# Patient Record
Sex: Male | Born: 1945
Health system: Southern US, Community
[De-identification: ages and names within clinical notes are randomized; demographics above are authoritative.]

## PROBLEM LIST (undated history)

## (undated) DIAGNOSIS — E039 Hypothyroidism, unspecified: Secondary | ICD-10-CM

## (undated) DIAGNOSIS — H269 Unspecified cataract: Secondary | ICD-10-CM

## (undated) DIAGNOSIS — K589 Irritable bowel syndrome without diarrhea: Secondary | ICD-10-CM

## (undated) DIAGNOSIS — C801 Malignant (primary) neoplasm, unspecified: Secondary | ICD-10-CM

## (undated) DIAGNOSIS — E079 Disorder of thyroid, unspecified: Secondary | ICD-10-CM

## (undated) DIAGNOSIS — T7840XA Allergy, unspecified, initial encounter: Secondary | ICD-10-CM

## (undated) DIAGNOSIS — E785 Hyperlipidemia, unspecified: Secondary | ICD-10-CM

## (undated) HISTORY — DX: Unspecified cataract: H26.9

## (undated) HISTORY — PX: BACK SURGERY: SHX140

## (undated) HISTORY — PX: CO2 LASER OF LEUKOPLAKIA: SHX1364

## (undated) HISTORY — PX: OTHER SURGICAL HISTORY: SHX169

## (undated) HISTORY — DX: Disorder of thyroid, unspecified: E07.9

## (undated) HISTORY — PX: HERNIA REPAIR: SHX51

## (undated) HISTORY — DX: Allergy, unspecified, initial encounter: T78.40XA

## (undated) HISTORY — PX: SPINE SURGERY: SHX786

## (undated) HISTORY — PX: EYE SURGERY: SHX253

## (undated) HISTORY — DX: Hyperlipidemia, unspecified: E78.5

## (undated) HISTORY — PX: SKIN CANCER DESTRUCTION: SHX778

## (undated) HISTORY — DX: Irritable bowel syndrome, unspecified: K58.9

---

## 2004-07-29 ENCOUNTER — Other Ambulatory Visit: Payer: Self-pay

## 2004-07-29 ENCOUNTER — Emergency Department: Payer: Self-pay | Admitting: Emergency Medicine

## 2004-07-30 ENCOUNTER — Ambulatory Visit: Payer: Self-pay | Admitting: Emergency Medicine

## 2005-01-02 ENCOUNTER — Ambulatory Visit: Payer: Self-pay | Admitting: Family Medicine

## 2005-12-23 ENCOUNTER — Ambulatory Visit: Payer: Self-pay

## 2005-12-30 ENCOUNTER — Ambulatory Visit: Payer: Self-pay | Admitting: Family Medicine

## 2006-06-14 ENCOUNTER — Emergency Department: Payer: Self-pay | Admitting: Emergency Medicine

## 2007-01-13 ENCOUNTER — Ambulatory Visit: Payer: Self-pay | Admitting: Family Medicine

## 2007-09-11 ENCOUNTER — Ambulatory Visit: Payer: Self-pay | Admitting: Emergency Medicine

## 2008-01-04 ENCOUNTER — Ambulatory Visit: Payer: Self-pay | Admitting: Family Medicine

## 2008-08-04 ENCOUNTER — Ambulatory Visit: Payer: Self-pay | Admitting: Family Medicine

## 2009-01-16 ENCOUNTER — Ambulatory Visit: Payer: Self-pay | Admitting: Family Medicine

## 2009-05-22 ENCOUNTER — Ambulatory Visit: Payer: Self-pay | Admitting: Anesthesiology

## 2009-05-24 ENCOUNTER — Ambulatory Visit: Payer: Self-pay | Admitting: Unknown Physician Specialty

## 2009-06-01 ENCOUNTER — Ambulatory Visit: Payer: Self-pay | Admitting: Anesthesiology

## 2009-06-14 ENCOUNTER — Ambulatory Visit: Payer: Self-pay | Admitting: Anesthesiology

## 2010-09-04 ENCOUNTER — Ambulatory Visit: Payer: Self-pay | Admitting: Gastroenterology

## 2010-09-11 ENCOUNTER — Other Ambulatory Visit: Payer: Self-pay | Admitting: Family Medicine

## 2011-02-20 ENCOUNTER — Other Ambulatory Visit: Payer: Self-pay | Admitting: Family Medicine

## 2012-03-15 ENCOUNTER — Other Ambulatory Visit: Payer: Self-pay | Admitting: Family Medicine

## 2012-06-27 ENCOUNTER — Ambulatory Visit: Payer: Self-pay

## 2012-06-27 LAB — RAPID STREP-A WITH REFLX: Micro Text Report: NEGATIVE

## 2012-06-29 LAB — BETA STREP CULTURE(ARMC)

## 2013-01-27 ENCOUNTER — Other Ambulatory Visit: Payer: Self-pay | Admitting: Family Medicine

## 2013-01-27 DIAGNOSIS — E039 Hypothyroidism, unspecified: Secondary | ICD-10-CM | POA: Diagnosis not present

## 2013-01-27 LAB — TSH: Thyroid Stimulating Horm: 1.84 u[IU]/mL

## 2013-01-28 LAB — PSA: PSA: 0.5 ng/mL (ref 0.0–4.0)

## 2013-02-04 DIAGNOSIS — E039 Hypothyroidism, unspecified: Secondary | ICD-10-CM | POA: Diagnosis not present

## 2013-07-06 DIAGNOSIS — Z23 Encounter for immunization: Secondary | ICD-10-CM | POA: Diagnosis not present

## 2013-08-08 ENCOUNTER — Other Ambulatory Visit: Payer: Self-pay | Admitting: Family Medicine

## 2013-08-08 DIAGNOSIS — J309 Allergic rhinitis, unspecified: Secondary | ICD-10-CM | POA: Diagnosis not present

## 2013-08-08 DIAGNOSIS — Z23 Encounter for immunization: Secondary | ICD-10-CM | POA: Diagnosis not present

## 2013-08-08 DIAGNOSIS — E039 Hypothyroidism, unspecified: Secondary | ICD-10-CM | POA: Diagnosis not present

## 2013-08-08 DIAGNOSIS — Z1331 Encounter for screening for depression: Secondary | ICD-10-CM | POA: Diagnosis not present

## 2013-08-08 DIAGNOSIS — Z0000 Encounter for general adult medical examination without abnormal findings: Secondary | ICD-10-CM | POA: Diagnosis not present

## 2013-08-08 DIAGNOSIS — Z1211 Encounter for screening for malignant neoplasm of colon: Secondary | ICD-10-CM | POA: Diagnosis not present

## 2013-08-19 ENCOUNTER — Other Ambulatory Visit: Payer: Self-pay | Admitting: Family Medicine

## 2013-08-19 DIAGNOSIS — Z125 Encounter for screening for malignant neoplasm of prostate: Secondary | ICD-10-CM | POA: Diagnosis not present

## 2013-09-07 DIAGNOSIS — H35379 Puckering of macula, unspecified eye: Secondary | ICD-10-CM | POA: Diagnosis not present

## 2013-09-07 DIAGNOSIS — H524 Presbyopia: Secondary | ICD-10-CM | POA: Diagnosis not present

## 2013-09-07 DIAGNOSIS — H2510 Age-related nuclear cataract, unspecified eye: Secondary | ICD-10-CM | POA: Diagnosis not present

## 2013-12-23 DIAGNOSIS — A499 Bacterial infection, unspecified: Secondary | ICD-10-CM | POA: Diagnosis not present

## 2013-12-23 DIAGNOSIS — B9689 Other specified bacterial agents as the cause of diseases classified elsewhere: Secondary | ICD-10-CM | POA: Diagnosis not present

## 2013-12-23 DIAGNOSIS — J309 Allergic rhinitis, unspecified: Secondary | ICD-10-CM | POA: Diagnosis not present

## 2013-12-23 DIAGNOSIS — J329 Chronic sinusitis, unspecified: Secondary | ICD-10-CM | POA: Diagnosis not present

## 2014-01-30 DIAGNOSIS — M7660 Achilles tendinitis, unspecified leg: Secondary | ICD-10-CM | POA: Diagnosis not present

## 2014-02-20 DIAGNOSIS — E039 Hypothyroidism, unspecified: Secondary | ICD-10-CM | POA: Diagnosis not present

## 2014-02-20 DIAGNOSIS — Z87891 Personal history of nicotine dependence: Secondary | ICD-10-CM | POA: Diagnosis not present

## 2014-02-20 DIAGNOSIS — Z125 Encounter for screening for malignant neoplasm of prostate: Secondary | ICD-10-CM | POA: Diagnosis not present

## 2014-02-20 DIAGNOSIS — Z136 Encounter for screening for cardiovascular disorders: Secondary | ICD-10-CM | POA: Diagnosis not present

## 2014-02-21 ENCOUNTER — Ambulatory Visit: Payer: Self-pay | Admitting: Family Medicine

## 2014-02-21 DIAGNOSIS — Z136 Encounter for screening for cardiovascular disorders: Secondary | ICD-10-CM | POA: Diagnosis not present

## 2014-02-21 LAB — TSH: Thyroid Stimulating Horm: 2.06 u[IU]/mL

## 2014-04-26 DIAGNOSIS — J189 Pneumonia, unspecified organism: Secondary | ICD-10-CM | POA: Diagnosis not present

## 2014-05-22 DIAGNOSIS — M7660 Achilles tendinitis, unspecified leg: Secondary | ICD-10-CM | POA: Diagnosis not present

## 2014-08-02 DIAGNOSIS — A499 Bacterial infection, unspecified: Secondary | ICD-10-CM | POA: Diagnosis not present

## 2014-08-02 DIAGNOSIS — M7661 Achilles tendinitis, right leg: Secondary | ICD-10-CM | POA: Diagnosis not present

## 2014-08-02 DIAGNOSIS — J329 Chronic sinusitis, unspecified: Secondary | ICD-10-CM | POA: Diagnosis not present

## 2014-09-13 DIAGNOSIS — H2513 Age-related nuclear cataract, bilateral: Secondary | ICD-10-CM | POA: Diagnosis not present

## 2014-09-13 DIAGNOSIS — H35372 Puckering of macula, left eye: Secondary | ICD-10-CM | POA: Diagnosis not present

## 2014-09-14 DIAGNOSIS — Z0000 Encounter for general adult medical examination without abnormal findings: Secondary | ICD-10-CM | POA: Diagnosis not present

## 2014-09-14 DIAGNOSIS — Z9181 History of falling: Secondary | ICD-10-CM | POA: Diagnosis not present

## 2014-09-14 DIAGNOSIS — Z23 Encounter for immunization: Secondary | ICD-10-CM | POA: Diagnosis not present

## 2014-09-14 DIAGNOSIS — Z1389 Encounter for screening for other disorder: Secondary | ICD-10-CM | POA: Diagnosis not present

## 2014-09-14 DIAGNOSIS — E039 Hypothyroidism, unspecified: Secondary | ICD-10-CM | POA: Diagnosis not present

## 2014-09-19 DIAGNOSIS — X32XXXA Exposure to sunlight, initial encounter: Secondary | ICD-10-CM | POA: Diagnosis not present

## 2014-09-19 DIAGNOSIS — Z85828 Personal history of other malignant neoplasm of skin: Secondary | ICD-10-CM | POA: Diagnosis not present

## 2014-09-19 DIAGNOSIS — D2261 Melanocytic nevi of right upper limb, including shoulder: Secondary | ICD-10-CM | POA: Diagnosis not present

## 2014-09-19 DIAGNOSIS — D225 Melanocytic nevi of trunk: Secondary | ICD-10-CM | POA: Diagnosis not present

## 2014-09-19 DIAGNOSIS — L570 Actinic keratosis: Secondary | ICD-10-CM | POA: Diagnosis not present

## 2014-09-19 DIAGNOSIS — D2262 Melanocytic nevi of left upper limb, including shoulder: Secondary | ICD-10-CM | POA: Diagnosis not present

## 2014-11-06 ENCOUNTER — Ambulatory Visit: Payer: Self-pay | Admitting: Family Medicine

## 2014-11-06 DIAGNOSIS — E039 Hypothyroidism, unspecified: Secondary | ICD-10-CM | POA: Diagnosis not present

## 2014-11-06 DIAGNOSIS — Z125 Encounter for screening for malignant neoplasm of prostate: Secondary | ICD-10-CM | POA: Diagnosis not present

## 2014-11-06 LAB — COMPREHENSIVE METABOLIC PANEL
ALT: 31 U/L
ANION GAP: 4 — AB (ref 7–16)
Albumin: 3.7 g/dL (ref 3.4–5.0)
Alkaline Phosphatase: 90 U/L
BILIRUBIN TOTAL: 0.6 mg/dL (ref 0.2–1.0)
BUN: 14 mg/dL (ref 7–18)
CHLORIDE: 108 mmol/L — AB (ref 98–107)
CREATININE: 1.34 mg/dL — AB (ref 0.60–1.30)
Calcium, Total: 8.5 mg/dL (ref 8.5–10.1)
Co2: 28 mmol/L (ref 21–32)
EGFR (African American): >60
EGFR (Non-African Amer.): 57 — ABNORMAL LOW
Glucose: 104 mg/dL — ABNORMAL HIGH (ref 65–99)
OSMOLALITY: 280 (ref 275–301)
Potassium: 4.2 mmol/L (ref 3.5–5.1)
SGOT(AST): 24 U/L (ref 15–37)
SODIUM: 140 mmol/L (ref 136–145)
Total Protein: 6.9 g/dL (ref 6.4–8.2)

## 2014-11-06 LAB — TSH: Thyroid Stimulating Horm: 3.19 u[IU]/mL

## 2014-11-07 LAB — PSA: PSA: 0.6 ng/mL

## 2015-01-22 DIAGNOSIS — J069 Acute upper respiratory infection, unspecified: Secondary | ICD-10-CM | POA: Diagnosis not present

## 2015-01-22 DIAGNOSIS — J40 Bronchitis, not specified as acute or chronic: Secondary | ICD-10-CM | POA: Diagnosis not present

## 2015-02-14 ENCOUNTER — Ambulatory Visit
Admission: RE | Admit: 2015-02-14 | Discharge: 2015-02-14 | Disposition: A | Discharge status: Home / Self Care | Payer: Medicare Other | Attending: Family Medicine | Admitting: Family Medicine

## 2015-02-14 ENCOUNTER — Other Ambulatory Visit: Payer: Self-pay | Admitting: Family Medicine

## 2015-02-14 ENCOUNTER — Ambulatory Visit
Admission: RE | Admit: 2015-02-14 | Discharge: 2015-02-14 | Disposition: A | Discharge status: Home / Self Care | Payer: Medicare Other | Source: Ambulatory Visit | Attending: Family Medicine | Admitting: Family Medicine

## 2015-02-14 ENCOUNTER — Other Ambulatory Visit: Payer: Self-pay | Admitting: Internal Medicine

## 2015-02-14 DIAGNOSIS — R05 Cough: Secondary | ICD-10-CM | POA: Diagnosis not present

## 2015-02-14 DIAGNOSIS — R059 Cough, unspecified: Secondary | ICD-10-CM

## 2015-02-14 DIAGNOSIS — J189 Pneumonia, unspecified organism: Secondary | ICD-10-CM | POA: Diagnosis not present

## 2015-02-14 DIAGNOSIS — R509 Fever, unspecified: Secondary | ICD-10-CM | POA: Diagnosis not present

## 2015-03-02 NOTE — Progress Notes (Signed)
Quick Note:  Patient already notified on [facility] chart ______

## 2015-05-03 ENCOUNTER — Ambulatory Visit (INDEPENDENT_AMBULATORY_CARE_PROVIDER_SITE_OTHER): Payer: Medicare Other | Admitting: Family Medicine

## 2015-05-03 ENCOUNTER — Encounter: Payer: Self-pay | Admitting: Family Medicine

## 2015-05-03 VITALS — BP 118/72 | HR 61 | Temp 98.1°F | Resp 18 | Ht 73.0 in | Wt 196.6 lb

## 2015-05-03 DIAGNOSIS — E038 Other specified hypothyroidism: Secondary | ICD-10-CM | POA: Diagnosis not present

## 2015-05-03 MED ORDER — LEVOTHYROXINE SODIUM 112 MCG PO TABS: 112.0000 ug | ORAL_TABLET | Freq: Once | ORAL | Status: DC

## 2015-05-03 NOTE — Patient Instructions (Signed)
6 

## 2015-05-03 NOTE — Progress Notes (Signed)
Name: Marc Jackson   MRN: [id number]    DOB: [birth year]   Date:05/03/2015       Progress Note  Subjective  Chief Complaint  Chief Complaint  Patient presents with  . Hypothyroidism    Thyroid Problem Presents for follow-up visit. Symptoms include hair loss. Patient reports no anxiety, cold intolerance, constipation, depressed mood, diarrhea, heat intolerance, hoarse voice, leg swelling, palpitations, tremors, weight gain or weight loss. The symptoms have been stable.      Past Medical History  Diagnosis Date  . Thyroid disease     History  Substance Use Topics  . Smoking status: Former Research scientist (life sciences)  . Smokeless tobacco: Not on file  . Alcohol Use: No     Current outpatient prescriptions:  .  levothyroxine (SYNTHROID, LEVOTHROID) 112 MCG tablet, , Disp: , Rfl:   Allergies  Allergen Reactions  . Penicillins   . Tetanus Toxoids     Review of Systems  Constitutional: Negative for fever, chills, weight loss and weight gain.  HENT: Negative for congestion, hearing loss, hoarse voice, sore throat and tinnitus.   Eyes: Negative for blurred vision, double vision and redness.  Respiratory: Negative for cough, hemoptysis and shortness of breath.   Cardiovascular: Negative for chest pain, palpitations, orthopnea, claudication and leg swelling.  Gastrointestinal: Negative for heartburn, nausea, vomiting, diarrhea, constipation and blood in stool.  Genitourinary: Negative for dysuria, urgency, frequency and hematuria.  Musculoskeletal: Negative for myalgias, back pain, joint pain, falls and neck pain.  Skin: Negative for itching.  Neurological: Negative for dizziness, tingling, tremors, focal weakness, seizures, loss of consciousness, weakness and headaches.  Endo/Heme/Allergies: Negative for cold intolerance and heat intolerance. Does not bruise/bleed easily.  Psychiatric/Behavioral: Negative for depression and substance abuse. The patient is not nervous/anxious and does not have  insomnia.      Objective  Filed Vitals:   05/03/15 1021  BP: 118/72  Pulse: 61  Temp: 98.1 F (36.7 C)  TempSrc: Oral  Resp: 18  Height: 6\' 1"  (1.854 m)  Weight: 196 lb 9.6 oz (89.177 kg)  SpO2: 94%     Physical Exam  Constitutional: He is oriented to person, place, and time and well-developed, well-nourished, and in no distress.  HENT:  Head: Normocephalic.  Eyes: EOM are normal. Pupils are equal, round, and reactive to light.  Neck: Normal range of motion. Neck supple. No thyromegaly present.  Cardiovascular: Normal rate, regular rhythm and normal heart sounds.   No murmur heard. Pulmonary/Chest: Effort normal and breath sounds normal. No respiratory distress. He has no wheezes.  Musculoskeletal: Normal range of motion. He exhibits no edema.  Lymphadenopathy:    He has no cervical adenopathy.  Neurological: He is alert and oriented to person, place, and time. No cranial nerve deficit. Gait normal. Coordination normal.  Skin: Skin is warm and dry. No rash noted.  Psychiatric: Affect and judgment normal.      Assessment & Plan

## 2015-05-04 LAB — TSH: TSH: 4.02 u[IU]/mL (ref 0.450–4.500)

## 2015-05-08 ENCOUNTER — Telehealth: Payer: Self-pay | Admitting: Emergency Medicine

## 2015-05-08 NOTE — Telephone Encounter (Signed)
Letter mailed to patient.

## 2015-09-18 DIAGNOSIS — D2261 Melanocytic nevi of right upper limb, including shoulder: Secondary | ICD-10-CM | POA: Diagnosis not present

## 2015-09-18 DIAGNOSIS — Z85828 Personal history of other malignant neoplasm of skin: Secondary | ICD-10-CM | POA: Diagnosis not present

## 2015-09-18 DIAGNOSIS — D2272 Melanocytic nevi of left lower limb, including hip: Secondary | ICD-10-CM | POA: Diagnosis not present

## 2015-09-18 DIAGNOSIS — D225 Melanocytic nevi of trunk: Secondary | ICD-10-CM | POA: Diagnosis not present

## 2015-09-19 DIAGNOSIS — H2513 Age-related nuclear cataract, bilateral: Secondary | ICD-10-CM | POA: Diagnosis not present

## 2015-09-19 DIAGNOSIS — H35372 Puckering of macula, left eye: Secondary | ICD-10-CM | POA: Diagnosis not present

## 2015-11-07 ENCOUNTER — Encounter: Payer: Medicare Other | Admitting: Family Medicine

## 2015-11-09 ENCOUNTER — Encounter: Payer: Self-pay | Admitting: Family Medicine

## 2015-11-09 ENCOUNTER — Encounter: Payer: Medicare Other | Admitting: Family Medicine

## 2015-11-09 ENCOUNTER — Ambulatory Visit (INDEPENDENT_AMBULATORY_CARE_PROVIDER_SITE_OTHER): Payer: Medicare Other | Admitting: Family Medicine

## 2015-11-09 VITALS — BP 122/78 | HR 72 | Temp 97.2°F | Resp 14 | Ht 73.0 in | Wt 198.0 lb

## 2015-11-09 DIAGNOSIS — Z85828 Personal history of other malignant neoplasm of skin: Secondary | ICD-10-CM | POA: Diagnosis not present

## 2015-11-09 DIAGNOSIS — Z113 Encounter for screening for infections with a predominantly sexual mode of transmission: Secondary | ICD-10-CM | POA: Diagnosis not present

## 2015-11-09 DIAGNOSIS — Z125 Encounter for screening for malignant neoplasm of prostate: Secondary | ICD-10-CM

## 2015-11-09 DIAGNOSIS — Z13220 Encounter for screening for lipoid disorders: Secondary | ICD-10-CM | POA: Diagnosis not present

## 2015-11-09 DIAGNOSIS — J309 Allergic rhinitis, unspecified: Secondary | ICD-10-CM

## 2015-11-09 DIAGNOSIS — E034 Atrophy of thyroid (acquired): Secondary | ICD-10-CM | POA: Diagnosis not present

## 2015-11-09 DIAGNOSIS — IMO0001 Reserved for inherently not codable concepts without codable children: Secondary | ICD-10-CM

## 2015-11-09 DIAGNOSIS — Z0000 Encounter for general adult medical examination without abnormal findings: Secondary | ICD-10-CM

## 2015-11-09 DIAGNOSIS — R0982 Postnasal drip: Secondary | ICD-10-CM

## 2015-11-09 DIAGNOSIS — Z136 Encounter for screening for cardiovascular disorders: Secondary | ICD-10-CM

## 2015-11-09 DIAGNOSIS — E038 Other specified hypothyroidism: Secondary | ICD-10-CM | POA: Diagnosis not present

## 2015-11-09 HISTORY — DX: Postnasal drip: R09.82

## 2015-11-09 HISTORY — DX: Encounter for general adult medical examination without abnormal findings: Z00.00

## 2015-11-09 HISTORY — DX: Allergic rhinitis, unspecified: J30.9

## 2015-11-09 NOTE — Progress Notes (Signed)
Name: Marc Jackson   MRN: [id number]    DOB: May 29, 1946   Date:11/09/2015       Progress Note  Subjective  Chief Complaint  Chief Complaint  Patient presents with  . Annual Exam    HPI  Patient is here today for a Complete Male Physical Exam:  The patient has no acute concerns. Overall feels healthy. Diet is well balanced. In general does exercise regularly, avid cyclist. Sees dentist regularly and addresses vision concerns with ophthalmologist if applicable. In regards to sexual activity the patient is currently sexually active. Currently is not concerned about exposure to any STDs. Due to have his lab work checked. Family history of prostate cancer in father, he would like PSA testing. Due to have thyroid hormones checked as well for well controled hypothyroidism.    Past Medical History  Diagnosis Date  . Thyroid disease     Past Surgical History  Procedure Laterality Date  . Back surgery    . Discetomy    . Hernia repair    . Co2 laser of leukoplakia    . Skin cancer destruction      Family History  Problem Relation Age of Onset  . Cancer Father   . Heart disease Father   . Hypothyroidism Sister   . Heart disease Brother   . Diabetes Brother   . Obesity Brother     Social History   Social History  . Marital Status: Married    Spouse Name: N/A  . Number of Children: N/A  . Years of Education: N/A   Occupational History  . Not on file.   Social History Main Topics  . Smoking status: Former Research scientist (life sciences)  . Smokeless tobacco: Not on file  . Alcohol Use: No  . Drug Use: No  . Sexual Activity:    Partners: Female   Other Topics Concern  . Not on file   Social History Narrative  . No narrative on file     Current outpatient prescriptions:  .  levothyroxine (SYNTHROID, LEVOTHROID) 112 MCG tablet, Take 1 tablet (112 mcg total) by mouth once., Disp: 90 tablet, Rfl: 3  Allergies  Allergen Reactions  . Penicillins   . Tetanus Toxoids      ROS  CONSTITUTIONAL: No significant weight changes, fever, chills, weakness or fatigue.  HEENT:  - Eyes: No visual changes.  - Ears: No auditory changes. No pain.  - Nose: No sneezing, congestion, runny nose. - Throat: No sore throat. No changes in swallowing. SKIN: No rash or itching.  CARDIOVASCULAR: No chest pain, chest pressure or chest discomfort. No palpitations or edema.  RESPIRATORY: No shortness of breath, cough or sputum.  GASTROINTESTINAL: No anorexia, nausea, vomiting. No changes in bowel habits. No abdominal pain or blood.  GENITOURINARY: No dysuria. No frequency. No discharge.  NEUROLOGICAL: No headache, dizziness, syncope, paralysis, ataxia, numbness or tingling in the extremities. No memory changes. No change in bowel or bladder control.  MUSCULOSKELETAL: No joint pain. No muscle pain. HEMATOLOGIC: No anemia, bleeding or bruising.  LYMPHATICS: No enlarged lymph nodes.  PSYCHIATRIC: No change in mood. No change in sleep pattern.  ENDOCRINOLOGIC: No reports of sweating, cold or heat intolerance. No polyuria or polydipsia.   Objective  Filed Vitals:   11/09/15 1106  BP: 122/78  Pulse: 72  Temp: 97.2 F (36.2 C)  TempSrc: Oral  Resp: 14  Height: 6\' 1"  (1.854 m)  Weight: 198 lb (89.812 kg)  SpO2: 94%   Body mass index is  26.13 kg/(m^2).  Depression screen PHQ 2/9 11/09/2015  Decreased Interest 0  Down, Depressed, Hopeless 0  PHQ - 2 Score 0     Physical Exam  Constitutional: Patient appears well-developed and well-nourished. In no distress.  HEENT:  - Head: Normocephalic and atraumatic.  - Ears: Bilateral TMs gray, no erythema or effusion - Nose: Nasal mucosa moist - Mouth/Throat: Oropharynx is clear and moist. No tonsillar hypertrophy or erythema. No post nasal drainage.  - Eyes: Conjunctivae clear, EOM movements normal. PERRLA. No scleral icterus.  Neck: Normal range of motion. Neck supple. No JVD present. No thyromegaly present.  Cardiovascular:  Normal rate, regular rhythm and normal heart sounds.  No murmur heard.  Pulmonary/Chest: Effort normal and breath sounds normal. No respiratory distress. Abdominal: Soft. Bowel sounds are normal, no distension. There is no tenderness. no masses BREAST: Bilateral breast exam normal with no masses, skin changes or nipple discharge MALE GENITALIA: Bilateral testes descended with no masses, no penile lesions, no penile discharge. PROSTATE: Normal prostate size and consistency. RECTAL: no rectal masses or hemorrhoids Musculoskeletal: Normal range of motion bilateral UE and LE, no joint effusions. Peripheral vascular: Bilateral LE no edema. Neurological: CN II-XII grossly intact with no focal deficits. Alert and oriented to person, place, and time. Coordination, balance, strength, speech and gait are normal.  Skin: Skin is warm and dry. No rash noted. No erythema.  Psychiatric: Patient has a normal mood and affect. Behavior is normal in office today. Judgment and thought content normal in office today.    Assessment & Plan  1. Annual physical exam Discussed in detail all recommended preventative measures appropriate for age and gender now and in the future. He declined Tetanus shot today.  - CBC with Differential/Platelet - Comprehensive metabolic panel - Lipid panel  2. Hypothyroidism due to acquired atrophy of thyroid  - CBC with Differential/Platelet - Comprehensive metabolic panel - TSH - T3, free - T4, free  3. History of skin cancer F/U with Dermatologist annually.   4. Screening for STD (sexually transmitted disease)  - Hepatitis C antibody  5. Encounter for cholesteral screening for cardiovascular disease - Lipid panel  6. Encounter for screening for malignant neoplasm of prostate I discussed with the patient the risks, benefits and indications of PSA testing. He is aware that the test is a controversial test and that the guidelines vary therefore we are employing shared  decision making in regards to testing serum PSA or not. He is aware that PSA testing may lead to over treatment, invasive procedures and over diagnosis of prostate cancer. He chooses to proceed with serum PSA testing.   - PSA

## 2015-11-10 LAB — LIPID PANEL
CHOL/HDL RATIO: 5 ratio (ref 0.0–5.0)
Cholesterol, Total: 217 mg/dL — ABNORMAL HIGH (ref 100–199)
HDL: 43 mg/dL (ref >39)
LDL CALC: 136 mg/dL — AB (ref 0–99)
TRIGLYCERIDES: 191 mg/dL — AB (ref 0–149)
VLDL CHOLESTEROL CAL: 38 mg/dL (ref 5–40)

## 2015-11-10 LAB — CBC WITH DIFFERENTIAL/PLATELET
BASOS ABS: 0 10*3/uL (ref 0.0–0.2)
BASOS: 1 %
EOS (ABSOLUTE): 0.3 10*3/uL (ref 0.0–0.4)
Eos: 7 %
HEMATOCRIT: 44.5 % (ref 37.5–51.0)
Hemoglobin: 14.9 g/dL (ref 12.6–17.7)
IMMATURE GRANS (ABS): 0 10*3/uL (ref 0.0–0.1)
IMMATURE GRANULOCYTES: 0 %
LYMPHS: 41 %
Lymphocytes Absolute: 1.7 10*3/uL (ref 0.7–3.1)
MCH: 31.6 pg (ref 26.6–33.0)
MCHC: 33.5 g/dL (ref 31.5–35.7)
MCV: 95 fL (ref 79–97)
MONOCYTES: 7 %
Monocytes Absolute: 0.3 10*3/uL (ref 0.1–0.9)
NEUTROS PCT: 44 %
Neutrophils Absolute: 1.8 10*3/uL (ref 1.4–7.0)
Platelets: 254 10*3/uL (ref 150–379)
RBC: 4.71 x10E6/uL (ref 4.14–5.80)
RDW: 14.8 % (ref 12.3–15.4)
WBC: 4.1 10*3/uL (ref 3.4–10.8)

## 2015-11-10 LAB — PSA: Prostate Specific Ag, Serum: 0.7 ng/mL (ref 0.0–4.0)

## 2015-11-10 LAB — COMPREHENSIVE METABOLIC PANEL
A/G RATIO: 2 (ref 1.1–2.5)
ALT: 26 IU/L (ref 0–44)
AST: 25 IU/L (ref 0–40)
Albumin: 4.7 g/dL (ref 3.6–4.8)
Alkaline Phosphatase: 92 IU/L (ref 39–117)
BUN / CREAT RATIO: 15 (ref 10–22)
BUN: 15 mg/dL (ref 8–27)
Bilirubin Total: 0.6 mg/dL (ref 0.0–1.2)
CALCIUM: 9.6 mg/dL (ref 8.6–10.2)
CO2: 26 mmol/L (ref 18–29)
Chloride: 98 mmol/L (ref 96–106)
Creatinine, Ser: 1 mg/dL (ref 0.76–1.27)
GFR calc Af Amer: 89 mL/min/{1.73_m2} (ref >59)
GFR, EST NON AFRICAN AMERICAN: 77 mL/min/{1.73_m2} (ref >59)
GLOBULIN, TOTAL: 2.3 g/dL (ref 1.5–4.5)
Glucose: 100 mg/dL — ABNORMAL HIGH (ref 65–99)
Potassium: 4.4 mmol/L (ref 3.5–5.2)
SODIUM: 138 mmol/L (ref 134–144)
Total Protein: 7 g/dL (ref 6.0–8.5)

## 2015-11-10 LAB — T3, FREE: T3 FREE: 2.9 pg/mL (ref 2.0–4.4)

## 2015-11-10 LAB — T4, FREE: Free T4: 1.1 ng/dL (ref 0.82–1.77)

## 2015-11-10 LAB — HEPATITIS C ANTIBODY: Hep C Virus Ab: 0.1 s/co ratio (ref 0.0–0.9)

## 2015-11-10 LAB — TSH: TSH: 2.54 u[IU]/mL (ref 0.450–4.500)

## 2015-11-12 ENCOUNTER — Telehealth: Payer: Self-pay | Admitting: Family Medicine

## 2015-11-12 ENCOUNTER — Other Ambulatory Visit: Payer: Self-pay | Admitting: Family Medicine

## 2015-11-12 NOTE — Telephone Encounter (Signed)
Patient is returning your call think it has something to do with lab results

## 2016-05-07 ENCOUNTER — Other Ambulatory Visit: Payer: Self-pay

## 2016-05-07 MED ORDER — LEVOTHYROXINE SODIUM 112 MCG PO TABS: 112.0000 ug | ORAL_TABLET | Freq: Every day | ORAL | 1 refills | Status: DC

## 2016-05-07 NOTE — Telephone Encounter (Signed)
Jan 2017 TSH, free T3, and free T4 reviewed; Rx approved

## 2016-09-08 DIAGNOSIS — D485 Neoplasm of uncertain behavior of skin: Secondary | ICD-10-CM | POA: Diagnosis not present

## 2016-09-08 DIAGNOSIS — L821 Other seborrheic keratosis: Secondary | ICD-10-CM | POA: Diagnosis not present

## 2016-09-08 DIAGNOSIS — S0080XA Unspecified superficial injury of other part of head, initial encounter: Secondary | ICD-10-CM | POA: Diagnosis not present

## 2016-09-08 DIAGNOSIS — B079 Viral wart, unspecified: Secondary | ICD-10-CM | POA: Diagnosis not present

## 2016-09-08 DIAGNOSIS — D2262 Melanocytic nevi of left upper limb, including shoulder: Secondary | ICD-10-CM | POA: Diagnosis not present

## 2016-09-08 DIAGNOSIS — Z85828 Personal history of other malignant neoplasm of skin: Secondary | ICD-10-CM | POA: Diagnosis not present

## 2016-09-08 DIAGNOSIS — D2261 Melanocytic nevi of right upper limb, including shoulder: Secondary | ICD-10-CM | POA: Diagnosis not present

## 2016-09-24 DIAGNOSIS — H2513 Age-related nuclear cataract, bilateral: Secondary | ICD-10-CM | POA: Diagnosis not present

## 2016-09-24 DIAGNOSIS — H35372 Puckering of macula, left eye: Secondary | ICD-10-CM | POA: Diagnosis not present

## 2016-09-24 DIAGNOSIS — H524 Presbyopia: Secondary | ICD-10-CM | POA: Diagnosis not present

## 2016-10-31 ENCOUNTER — Other Ambulatory Visit: Payer: Self-pay | Admitting: Family Medicine

## 2016-10-31 NOTE — Telephone Encounter (Signed)
Reviewed thyroid tests from Jan 2017; Rx approved He has upcoming appt

## 2016-11-10 ENCOUNTER — Encounter: Payer: Medicare Other | Admitting: Family Medicine

## 2016-11-11 ENCOUNTER — Encounter: Payer: Self-pay | Admitting: Family Medicine

## 2016-11-11 ENCOUNTER — Ambulatory Visit (INDEPENDENT_AMBULATORY_CARE_PROVIDER_SITE_OTHER): Payer: Medicare Other | Admitting: Family Medicine

## 2016-11-11 VITALS — BP 120/74 | HR 66 | Temp 98.2°F | Resp 14 | Ht 73.0 in | Wt 203.4 lb

## 2016-11-11 DIAGNOSIS — Z0000 Encounter for general adult medical examination without abnormal findings: Secondary | ICD-10-CM

## 2016-11-11 DIAGNOSIS — E782 Mixed hyperlipidemia: Secondary | ICD-10-CM | POA: Diagnosis not present

## 2016-11-11 DIAGNOSIS — E785 Hyperlipidemia, unspecified: Secondary | ICD-10-CM | POA: Insufficient documentation

## 2016-11-11 DIAGNOSIS — E034 Atrophy of thyroid (acquired): Secondary | ICD-10-CM

## 2016-11-11 DIAGNOSIS — Z789 Other specified health status: Secondary | ICD-10-CM

## 2016-11-11 DIAGNOSIS — Z85828 Personal history of other malignant neoplasm of skin: Secondary | ICD-10-CM | POA: Diagnosis not present

## 2016-11-11 DIAGNOSIS — Z7289 Other problems related to lifestyle: Secondary | ICD-10-CM

## 2016-11-11 DIAGNOSIS — Z8042 Family history of malignant neoplasm of prostate: Secondary | ICD-10-CM | POA: Insufficient documentation

## 2016-11-11 DIAGNOSIS — Z125 Encounter for screening for malignant neoplasm of prostate: Secondary | ICD-10-CM | POA: Insufficient documentation

## 2016-11-11 LAB — PSA: PSA: 0.7 ng/mL (ref <=4.0)

## 2016-11-11 LAB — COMPLETE METABOLIC PANEL WITH GFR
ALBUMIN: 4.1 g/dL (ref 3.6–5.1)
ALK PHOS: 87 U/L (ref 40–115)
ALT: 23 U/L (ref 9–46)
AST: 23 U/L (ref 10–35)
BUN: 17 mg/dL (ref 7–25)
CALCIUM: 9.4 mg/dL (ref 8.6–10.3)
CO2: 26 mmol/L (ref 20–31)
CREATININE: 1.2 mg/dL (ref 0.70–1.25)
Chloride: 104 mmol/L (ref 98–110)
GFR, Est African American: 71 mL/min (ref >=60)
GFR, Est Non African American: 61 mL/min (ref >=60)
GLUCOSE: 106 mg/dL — AB (ref 65–99)
POTASSIUM: 5.3 mmol/L (ref 3.5–5.3)
SODIUM: 141 mmol/L (ref 135–146)
TOTAL PROTEIN: 7 g/dL (ref 6.1–8.1)
Total Bilirubin: 0.7 mg/dL (ref 0.2–1.2)

## 2016-11-11 LAB — LIPID PANEL
CHOL/HDL RATIO: 4.4 ratio (ref <5.0)
CHOLESTEROL: 212 mg/dL — AB (ref <200)
HDL: 48 mg/dL (ref >40)
LDL Cholesterol: 130 mg/dL — ABNORMAL HIGH (ref <100)
Triglycerides: 172 mg/dL — ABNORMAL HIGH (ref <150)
VLDL: 34 mg/dL — ABNORMAL HIGH (ref <30)

## 2016-11-11 LAB — TSH: TSH: 2.83 mIU/L (ref 0.40–4.50)

## 2016-11-11 NOTE — Assessment & Plan Note (Signed)
USPSTF grade A and B recommendations reviewed with patient; age-appropriate recommendations, preventive care, screening tests, etc discussed and encouraged; healthy living encouraged; see AVS for patient education given to patient  

## 2016-11-11 NOTE — Assessment & Plan Note (Signed)
Check TSH and adjust medicine if needed 

## 2016-11-11 NOTE — Assessment & Plan Note (Signed)
Check labs today; sugar and 2% creamer in coffee, o/w fasting

## 2016-11-11 NOTE — Assessment & Plan Note (Signed)
Check PSA and then DRE

## 2016-11-11 NOTE — Patient Instructions (Addendum)

## 2016-11-11 NOTE — Assessment & Plan Note (Signed)
Seeing dermatology; sunscreen

## 2016-11-11 NOTE — Assessment & Plan Note (Signed)
Check PSA. ?

## 2016-11-11 NOTE — Progress Notes (Signed)
Patient: Marc Jackson, Male    DOB: 28-Dec-1945, 71 y.o.   MRN: [id number]  Visit Date: 11/23/2016  Today's Provider: Enid Derry, MD   Chief Complaint  Patient presents with  . Medicare Wellness    Subjective:   Marc Jackson is a 71 y.o. male who presents today for his Subsequent Annual Wellness Visit.  Caregiver input:  N/a Patient is new to me; his previous primary left the practice He has hypothyroidism and is due for labs and needs medicine refills; has had hypothyroidism for years He has high cholesterol; does not want to take a statin  USPSTF grade A and B recommendations Depression:  Depression screen [facility] 2/9 11/11/2016 11/09/2015  Decreased Interest 0 0  Down, Depressed, Hopeless 0 0  PHQ - 2 Score 0 0   Hypertension: excellent control Obesity: no Alcohol: more than 14 drinks a week Tobacco use: quit 33 years  HIV, hep B, hep C:  Already done STD testing and prevention (chl/gon/syphilis): declined Lipids: mother had high TG; reviewed last lipids, mildly elevated Glucose: today Colorectal cancer: 2011; 10 year pass Prostate cancer: father had prostate cancer which metastasized; fair urination; check PSA Breast cancer: no lumps Lung cancer: n/a, remote Osteoporosis: no steroids as a child AAA: done in 2015, normal caliber Aspirin: no aspirin right now; suggested Diet: typical American, combination Exercise: cycling, active Skin cancer: has had three or four removed from face; two were BCC and two were SCC; sees derm Dr. Evorn Gong once a year Over at [employer] for eyes; little cataract forming on right eye  HPI  Review of Systems  Past Medical History:  Diagnosis Date  . IBS (irritable bowel syndrome)   . Thyroid disease     Past Surgical History:  Procedure Laterality Date  . BACK SURGERY    . CO2 LASER OF LEUKOPLAKIA    . discetomy    . HERNIA REPAIR    . SKIN CANCER DESTRUCTION    Dr. Mauri Pole operated on herniated disc; has recurrences from  time to time; sees D.O. from time to time, spine doctor who does epidural injections  Family History  Problem Relation Age of Onset  . Cancer Father     prostate  . Heart disease Father   . Hypothyroidism Sister   . Heart disease Mother     CHF, atrial fibrillation  . Hyperlipidemia Mother     high TG  . Diabetes Brother   . Heart disease Brother   . Obesity Brother   MD note: mother had twisted bowel, tumor in bowel, but not known, possibly benign  Social History   Social History  . Marital status: Married    Spouse name: N/A  . Number of children: N/A  . Years of education: N/A   Occupational History  . Not on file.   Social History Main Topics  . Smoking status: Former Research scientist (life sciences)  . Smokeless tobacco: Never Used  . Alcohol use 16.8 oz/week    28 Glasses of wine per week  . Drug use: No  . Sexual activity: Yes    Partners: Female   Other Topics Concern  . Not on file   Social History Narrative  . No narrative on file   Outpatient Encounter Prescriptions as of 11/11/2016  Medication Sig  . [DISCONTINUED] levothyroxine (SYNTHROID, LEVOTHROID) 112 MCG tablet TAKE ONE TABLET BY MOUTH EVERY MORNING BEFORE BREAKFAST   No facility-administered encounter medications on file as of 11/11/2016.     Functional Ability /  Safety Screening 1.  Was the timed Get Up and Go test longer than 30 seconds?  no 2.  Does the patient need help with the phone, transportation, shopping,      preparing meals, housework, laundry, medications, or managing money?  no 3.  Does the patient's home have:  loose throw rugs in the hallway?   yes  Nonstick      Grab bars in the bathroom? no      Handrails on the stairs?   yes      Poor lighting?   no 4.  Has the patient noticed any hearing difficulties?   no  Fall Risk Assessment See under rooming  Depression Screen See under rooming Depression screen [facility] 2/9 11/11/2016 11/09/2015  Decreased Interest 0 0  Down, Depressed, Hopeless 0 0  PHQ -  2 Score 0 0    Advanced Directives Does patient have a HCPOA?    yes If yes, name and contact information: Maricela Humpal Does patient have a living will or MOST form?  yes  Not sure if copy; see copy  Objective:   Vitals: BP 120/74   Pulse 66   Temp 98.2 F (36.8 C) (Oral)   Resp 14   Ht 6\' 1"  (1.854 m)   Wt 203 lb 6 oz (92.3 kg)   SpO2 97%   BMI 26.83 kg/m  Body mass index is 26.83 kg/m. No exam data present  Physical Exam Mood/affect:  Very pleasant, euthymic Appearance:  Neatly and casually dressed  Cognitive Testing - 6-CIT  Correct? Score   What year is it? yes 0 Yes = 0    No = 4  What month is it? yes 0 Yes = 0    No = 3  Remember:     [name], [place], [place]     What time is it? yes 0 Yes = 0    No = 3  Count backwards from 20 to 1 yes 0 Correct = 0    1 error = 2   More than 1 error = 4  Say the months of the year in reverse. yes 0 Correct = 0    1 error = 2   More than 1 error = 4  What address did I ask you to remember? yes 0 Correct = 0  1 error = 2    2 error = 4    3 error = 6    4 error = 8    All wrong = 10       TOTAL SCORE  0/28   Interpretation:  Normal  Normal (0-7) Abnormal (8-28)    Assessment & Plan:     Annual Wellness Visit  Reviewed patient's Family Medical History Reviewed and updated list of patient's medical providers Assessment of cognitive impairment was done Assessed patient's functional ability Established a written schedule for health screening [place] Completed and Reviewed  Immunization History  Administered Date(s) Administered  . Influenza-Unspecified 08/01/2015, 07/30/2016  . Pneumococcal Conjugate-13 09/14/2014  . Pneumococcal-Unspecified 08/08/2013  . Zoster 07/13/2010    Health Maintenance  Topic Date Due  . Samul Dada  09/14/1966  . COLONOSCOPY  10/14/2019  . INFLUENZA VACCINE  Completed  . ZOSTAVAX  Completed  . Hepatitis C Screening  Completed  . PNA vac Low Risk  Adult  Completed    Discussed health benefits of physical activity, and encouraged him to engage in regular exercise appropriate for his age  and condition.   No orders of the defined types were placed in this encounter.   Current Outpatient Prescriptions:  .  levothyroxine (SYNTHROID, LEVOTHROID) 112 MCG tablet, Take 1 tablet (112 mcg total) by mouth daily with breakfast., Disp: 90 tablet, Rfl: 3 There are no discontinued medications.  Next Medicare Wellness Visit in 12+ months  Problem List Items Addressed This Visit      Endocrine   Hypothyroidism due to acquired atrophy of thyroid    Check TSH and adjust medicine if needed      Relevant Orders   TSH (Completed)     Other   Screening PSA (prostate specific antigen)    Check PSA and then DRE      Relevant Orders   PSA (Completed)   Screening for prostate cancer    Check PSA      Relevant Orders   PSA (Completed)   Hyperlipidemia    Check labs today; sugar and 2% creamer in coffee, o/w fasting      Relevant Orders   Lipid panel (Completed)   COMPLETE METABOLIC PANEL WITH GFR (Completed)   History of skin cancer    Seeing dermatology; sunscreen      Family hx of prostate cancer    Check PSA      Relevant Orders   PSA (Completed)   Annual physical exam    USPSTF grade A and B recommendations reviewed with patient; age-appropriate recommendations, preventive care, screening tests, etc discussed and encouraged; healthy living encouraged; see AVS for patient education given to patient       Alcohol use    Currently drinking more than 14 drinks per week; discussed safe recommended limits for alcohol use in men, encouraged him to cut back and contact me if anything I can do to help if he finds it difficult to decrease his drinking       Other Visit Diagnoses    Encounter for screening and preventative care    -  Primary

## 2016-11-13 ENCOUNTER — Other Ambulatory Visit: Payer: Self-pay | Admitting: Family Medicine

## 2016-11-13 MED ORDER — LEVOTHYROXINE SODIUM 112 MCG PO TABS: 112.0000 ug | ORAL_TABLET | Freq: Every day | ORAL | 3 refills | Status: DC

## 2016-11-13 NOTE — Progress Notes (Signed)
rx for thyroid med sent; note to patient about starting statin

## 2016-11-23 DIAGNOSIS — Z789 Other specified health status: Secondary | ICD-10-CM

## 2016-11-23 DIAGNOSIS — Z7289 Other problems related to lifestyle: Secondary | ICD-10-CM | POA: Insufficient documentation

## 2016-11-23 HISTORY — DX: Other specified health status: Z78.9

## 2016-11-23 HISTORY — DX: Other problems related to lifestyle: Z72.89

## 2016-11-23 NOTE — Assessment & Plan Note (Signed)
Currently drinking more than 14 drinks per week; discussed safe recommended limits for alcohol use in men, encouraged him to cut back and contact me if anything I can do to help if he finds it difficult to decrease his drinking

## 2017-08-12 DIAGNOSIS — Z23 Encounter for immunization: Secondary | ICD-10-CM | POA: Diagnosis not present

## 2017-09-07 DIAGNOSIS — Z85828 Personal history of other malignant neoplasm of skin: Secondary | ICD-10-CM | POA: Diagnosis not present

## 2017-09-07 DIAGNOSIS — D225 Melanocytic nevi of trunk: Secondary | ICD-10-CM | POA: Diagnosis not present

## 2017-09-07 DIAGNOSIS — D2272 Melanocytic nevi of left lower limb, including hip: Secondary | ICD-10-CM | POA: Diagnosis not present

## 2017-09-07 DIAGNOSIS — D2261 Melanocytic nevi of right upper limb, including shoulder: Secondary | ICD-10-CM | POA: Diagnosis not present

## 2017-09-16 DIAGNOSIS — K120 Recurrent oral aphthae: Secondary | ICD-10-CM | POA: Diagnosis not present

## 2017-09-29 DIAGNOSIS — H25013 Cortical age-related cataract, bilateral: Secondary | ICD-10-CM | POA: Diagnosis not present

## 2017-09-29 DIAGNOSIS — H524 Presbyopia: Secondary | ICD-10-CM | POA: Diagnosis not present

## 2017-09-29 DIAGNOSIS — H35372 Puckering of macula, left eye: Secondary | ICD-10-CM | POA: Diagnosis not present

## 2017-09-29 DIAGNOSIS — H2513 Age-related nuclear cataract, bilateral: Secondary | ICD-10-CM | POA: Diagnosis not present

## 2017-10-29 DIAGNOSIS — H2513 Age-related nuclear cataract, bilateral: Secondary | ICD-10-CM | POA: Diagnosis not present

## 2017-11-10 ENCOUNTER — Ambulatory Visit (INDEPENDENT_AMBULATORY_CARE_PROVIDER_SITE_OTHER): Payer: Medicare Other | Admitting: Family Medicine

## 2017-11-10 ENCOUNTER — Other Ambulatory Visit: Payer: Self-pay

## 2017-11-10 ENCOUNTER — Encounter: Payer: Self-pay | Admitting: Family Medicine

## 2017-11-10 VITALS — BP 114/72 | HR 77 | Temp 98.1°F | Resp 16 | Ht 73.0 in | Wt 196.3 lb

## 2017-11-10 DIAGNOSIS — E782 Mixed hyperlipidemia: Secondary | ICD-10-CM | POA: Diagnosis not present

## 2017-11-10 DIAGNOSIS — R079 Chest pain, unspecified: Secondary | ICD-10-CM | POA: Diagnosis not present

## 2017-11-10 DIAGNOSIS — K429 Umbilical hernia without obstruction or gangrene: Secondary | ICD-10-CM | POA: Diagnosis not present

## 2017-11-10 DIAGNOSIS — E034 Atrophy of thyroid (acquired): Secondary | ICD-10-CM | POA: Diagnosis not present

## 2017-11-10 DIAGNOSIS — R739 Hyperglycemia, unspecified: Secondary | ICD-10-CM

## 2017-11-10 DIAGNOSIS — Z0000 Encounter for general adult medical examination without abnormal findings: Secondary | ICD-10-CM

## 2017-11-10 DIAGNOSIS — Z8042 Family history of malignant neoplasm of prostate: Secondary | ICD-10-CM

## 2017-11-10 DIAGNOSIS — Z125 Encounter for screening for malignant neoplasm of prostate: Secondary | ICD-10-CM

## 2017-11-10 HISTORY — DX: Hyperglycemia, unspecified: R73.9

## 2017-11-10 MED ORDER — LEVOTHYROXINE SODIUM 112 MCG PO TABS: 112.0000 ug | ORAL_TABLET | Freq: Every day | ORAL | 3 refills | Status: DC

## 2017-11-10 NOTE — Assessment & Plan Note (Signed)
USPSTF grade A and B recommendations reviewed with patient; age-appropriate recommendations, preventive care, screening tests, etc discussed and encouraged; healthy living encouraged; see AVS for patient education given to patient  

## 2017-11-10 NOTE — Progress Notes (Signed)
Patient: Marc Jackson, Male    DOB: 1946-07-15, 72 y.o.   MRN: [id number]  Visit Date: 11/10/2017  Today's Provider: Enid Derry, MD   Chief Complaint  Patient presents with  . Medicare Wellness    Subjective:   Marc Jackson is a 72 y.o. male who presents today for his Subsequent Annual Wellness Visit.  He has been having chest pain the last few days, 2-3 days; little twinge and then nothing for days; pain lasts just a second; no jaw pain or neck pain; no left arm pain, no shortness of breath; no nausea Exercises and no pain, no chest pressure Rode 46 miles a few weeks ago to [place] and back and no problems Not exercised induced Does side planks and not reproduce it Mother and father both had CHF; no atherosclerosis in the family; was a smoker but quit 34 yrs Used to have heartburn, evaluated for his heart years ago and had f/u stress test; maybe 10 years ago  Discussed prostate, no sx of obstruction; last 3 PSA readings rechecked; understands the false positive risk; he isn't sure what he would do if it were elevated; not crazy about treatments; father had metastatic prostate cancer; had radiation therapy to relieve the pain; he is a data person  Hypothyroidism; no change in symptoms; taking medicine every day; due for refills today  He has an umbilical hernia; asked if he needs to do anything  USPSTF grade A and B recommendations Depression:  Depression screen [facility] 2/9 11/10/2017 11/11/2016 11/09/2015  Decreased Interest 0 0 0  Down, Depressed, Hopeless 0 0 0  PHQ - 2 Score 0 0 0   Hypertension: BP Readings from Last 3 Encounters:  11/10/17 114/72  11/11/16 120/74  11/09/15 122/78   Obesity: down to 193.6 pounds at home, lost weight from stress, ran for election, canvassing and working hard, walking the streets [employer] Readings from Last 3 Encounters:  11/10/17 196 lb 4.8 oz (89 kg)  11/11/16 203 lb 6 oz (92.3 kg)  11/09/15 198 lb (89.8 kg)   BMI Readings from Last 3  Encounters:  11/10/17 25.90 kg/m  11/11/16 26.83 kg/m  11/09/15 26.12 kg/m    Skin cancer: no worrisome moles; saw dermatologist a few years ago Lung cancer:  Remote smoker; no chest CT indicated Prostate cancer:  Lab Results  Component Value Date   PSA 0.7 11/11/2016   PSA 0.6 11/06/2014   PSA 0.6 08/19/2013   Colorectal cancer: 2011, next due 2021 per patient AAA: n/a, normal May 2015, reviewed report Aspirin: daily recommended earlier, just four times a week though Diet: tries to eat healthy, but travels some, Sunday after church, [place] BLT sandwich or swiss and [place] with curly fries; 3-4 servings of fish a week; eats fair amount of cheese Exercise: yes Alcohol: recommends to 14 drinks a week Tobacco use: remote HIV, hep B, hep C: both done, through screening STD testing and prevention (chl/gon/syphilis): not intersted  Lipids: check lipids today, nonfasting Lab Results  Component Value Date   CHOL 212 (H) 11/11/2016   CHOL 217 (H) 11/09/2015   Lab Results  Component Value Date   HDL 48 11/11/2016   HDL 43 11/09/2015   Lab Results  Component Value Date   LDLCALC 130 (H) 11/11/2016   LDLCALC 136 (H) 11/09/2015   Lab Results  Component Value Date   TRIG 172 (H) 11/11/2016   TRIG 191 (H) 11/09/2015   Lab Results  Component Value Date   CHOLHDL  4.4 11/11/2016   CHOLHDL 5.0 11/09/2015   No results found for: LDLDIRECT Glucose: coffee with sugar one hour ago Glucose  Date Value Ref Range Status  11/09/2015 100 (H) 65 - 99 mg/dL Final  11/06/2014 104 (H) 65 - 99 mg/dL Final   Glucose, Bld  Date Value Ref Range Status  11/11/2016 106 (H) 65 - 99 mg/dL Final    Review of Systems  Constitutional: Positive for unexpected weight change.  Cardiovascular: Positive for chest pain (fleeting). Negative for leg swelling.   Past Medical History:  Diagnosis Date  . Cataract   . IBS (irritable bowel syndrome)   . Thyroid disease     Past Surgical History:   Procedure Laterality Date  . BACK SURGERY    . CO2 LASER OF [place]    . discetomy    . HERNIA REPAIR    . SKIN CANCER DESTRUCTION      Family History  Problem Relation Age of Onset  . Cancer Father        prostate  . Heart disease Father   . Hypothyroidism Sister   . Heart disease Mother        CHF, atrial fibrillation  . Hyperlipidemia Mother        high TG  . Diabetes Brother   . Heart disease Brother   . Obesity Brother     Social History   Tobacco Use  . Smoking status: Former Research scientist (life sciences)  . Smokeless tobacco: Never Used  Substance Use Topics  . Alcohol use: Yes    Alcohol/week: 16.8 oz    Types: 28 Glasses of wine per week  . Drug use: No    Outpatient Encounter Medications as of 11/10/2017  Medication Sig  . ASPIRIN EC LO-DOSE PO Take 81 mg by mouth 4 (four) times a week.  . levothyroxine (SYNTHROID, LEVOTHROID) 112 MCG tablet Take 1 tablet (112 mcg total) by mouth daily with breakfast.  . [DISCONTINUED] levothyroxine (SYNTHROID, LEVOTHROID) 112 MCG tablet Take 1 tablet (112 mcg total) by mouth daily with breakfast.  . fluocinonide gel (LIDEX) 0.05 %    No facility-administered encounter medications on file as of 11/10/2017.     Functional Ability / Safety Screening 1.  Was the timed Get Up and Go test less than 12 seconds?  yes 2.  Does the patient need help with the phone, transportation, shopping,      preparing meals, housework, laundry, medications, or managing money?  no 3.  Does the patient's home have:  loose throw rugs in the hallway?   no      Grab bars in the bathroom? yes      Handrails on the stairs?   yes      Good lighting?   yes 4.  Has the patient noticed any hearing difficulties?   no  Advanced Directives Does patient have a HCPOA?    yes If yes, name and contact information: wife  Marc Jackson, [phone] Does patient have a living will or MOST form?  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 No exam data present  Physical Exam  Constitutional: He appears well-developed and well-nourished. No distress.  Eyes: No scleral icterus.  Neck: Carotid bruit is not present.  Cardiovascular: Normal rate and regular rhythm.  Pulmonary/Chest: Effort normal and breath sounds normal.  Abdominal: He exhibits no distension. A hernia (umbilical, soft, nontender) is present.  Musculoskeletal: He exhibits no edema.  Neurological: He is alert.  Skin: No pallor.  Psychiatric: He has a normal mood and affect.   Mood/affect:  euthymic Appearance:  Neatly dressed  6CIT Screen 11/10/2017  What Year? 0 points  What month? 0 points  What time? 0 points  Count back from 20 0 points  Months in reverse 0 points  Repeat phrase 0 points  Total Score 0    Assessment & Plan:     Annual Wellness Visit  Reviewed patient's Family Medical History Reviewed and updated list of patient's medical providers Assessment of cognitive impairment was done Assessed patient's functional ability Established a written schedule for health screening [place] Completed and Reviewed  Exercise Activities and Dietary recommendations Goals    . Weight (lb) < 189 lb 6.4 oz (85.9 kg)       Immunization History  Administered Date(s) Administered  . Influenza-Unspecified 08/01/2015, 07/30/2016, 07/30/2017  .  Pneumococcal Conjugate-13 09/14/2014  . Pneumococcal-Unspecified 08/08/2013  . Zoster 07/13/2010    Health Maintenance  Topic Date Due  . TETANUS/TDAP  11/10/2018 (Originally 09/14/1966)  . COLONOSCOPY  10/14/2019  . INFLUENZA VACCINE  Completed  . Hepatitis C Screening  Completed  . PNA vac Low Risk Adult  Completed    Discussed health benefits of physical activity, and encouraged him to engage in regular exercise appropriate for his age and condition.   Meds ordered this encounter  Medications  . levothyroxine (SYNTHROID, LEVOTHROID) 112 MCG tablet    Sig: Take 1 tablet (112 mcg total) by mouth daily with breakfast.    Dispense:  90 tablet    Refill:  3    Current Outpatient Medications:  .  ASPIRIN EC LO-DOSE PO, Take 81 mg by mouth 4 (four) times a week., Disp: , Rfl:  .  levothyroxine (SYNTHROID, LEVOTHROID) 112 MCG tablet, Take 1 tablet (112 mcg total) by mouth daily with breakfast., Disp: 90 tablet, Rfl: 3 .  fluocinonide gel (LIDEX) 0.05 %, , Disp: , Rfl:  Medications Discontinued During This Encounter  Medication Reason  . levothyroxine (SYNTHROID, LEVOTHROID) 112 MCG tablet Reorder    Next Medicare Wellness Visit in 12+ months  Problem List Items Addressed This Visit      Endocrine   Hypothyroidism due to acquired atrophy of thyroid    Check TSH today      Relevant Medications   levothyroxine (SYNTHROID, LEVOTHROID) 112 MCG tablet   Other Relevant Orders   TSH     Other   Screening for prostate cancer    Discussed new age guidelines, 41-69; offered PSA if he feels strongly since his longevity is expected to surpass typical male      Relevant Orders   PSA   Hyperlipidemia    Check fasting lipids today      Relevant Medications  ASPIRIN EC LO-DOSE PO   Other Relevant Orders   Lipid panel   Hyperglycemia    nonfasting today (had sugar in coffee); check A1c      Relevant Orders   COMPLETE METABOLIC PANEL WITH GFR   Hemoglobin A1c   Family hx  of prostate cancer    Patient has a life expectancy longer than typical male, and fam hx; willing to get PSA today and will bill Medicare      Annual physical exam - Primary    USPSTF grade A and B recommendations reviewed with patient; age-appropriate recommendations, preventive care, screening tests, etc discussed and encouraged; healthy living encouraged; see AVS for patient education given to patient        Other Visit Diagnoses    Chest pain, unspecified type       Relevant Orders   Ambulatory referral to Cardiology   EKG 12-Lead   CBC with Differential/Platelet   COMPLETE METABOLIC PANEL WITH GFR   Umbilical hernia without obstruction and without gangrene       recommended referral to surgeon; better to address now, than to wait until larger and possibly may need mesh, as active as hpatient says he'll take care of appt

## 2017-11-10 NOTE — Patient Instructions (Addendum)
Consider getting the new shingles vaccine called Shingrix; that is available for individuals 71 years of age and older, and is recommended even if you have had shingles in the past and/or already received the old shingles vaccine (Zostavax); it is a two-part series, and is available at many local pharmacies  Health Maintenance  Topic Date Due  . TETANUS/TDAP  11/10/2018 (Originally 09/14/1966)  . COLONOSCOPY  10/14/2019  . INFLUENZA VACCINE  Completed  . Hepatitis C Screening  Completed  . PNA vac Low Risk Adult  Completed    Health Maintenance, Male A healthy lifestyle and preventive care is important for your health and wellness. Ask your health care provider about what schedule of regular examinations is right for you. What should I know about weight and diet? Eat a Healthy Diet  Eat plenty of vegetables, fruits, whole grains, low-fat dairy products, and lean protein.  Do not eat a lot of foods high in solid fats, added sugars, or salt.  Maintain a Healthy Weight Regular exercise can help you achieve or maintain a healthy weight. You should:  Do at least 150 minutes of exercise each week. The exercise should increase your heart rate and make you sweat (moderate-intensity exercise).  Do strength-training exercises at least twice a week.  Watch Your Levels of Cholesterol and Blood Lipids  Have your blood tested for lipids and cholesterol every 5 years starting at 71 years of age. If you are at high risk for heart disease, you should start having your blood tested when you are 71 years old. You may need to have your cholesterol levels checked more often if: ? Your lipid or cholesterol levels are high. ? You are older than 71 years of age. ? You are at high risk for heart disease.  What should I know about cancer screening? Many types of cancers can be detected early and may often be prevented. Lung Cancer  You should be screened every year for lung cancer if: ? You are a  current smoker who has smoked for at least 30 years. ? You are a former smoker who has quit within the past 15 years.  Talk to your health care provider about your screening options, when you should start screening, and how often you should be screened.  Colorectal Cancer  Routine colorectal cancer screening usually begins at 71 years of age and should be repeated every 5-10 years until you are 71 years old. You may need to be screened more often if early forms of precancerous polyps or small growths are found. Your health care provider may recommend screening at an earlier age if you have risk factors for colon cancer.  Your health care provider may recommend using home test kits to check for hidden blood in the stool.  A small camera at the end of a tube can be used to examine your colon (sigmoidoscopy or colonoscopy). This checks for the earliest forms of colorectal cancer.  Prostate and Testicular Cancer  Depending on your age and overall health, your health care provider may do certain tests to screen for prostate and testicular cancer.  Talk to your health care provider about any symptoms or concerns you have about testicular or prostate cancer.  Skin Cancer  Check your skin from head to toe regularly.  Tell your health care provider about any new moles or changes in moles, especially if: ? There is a change in a mole's size, shape, or color. ? You have a mole that is larger  than a pencil eraser.  Always use sunscreen. Apply sunscreen liberally and repeat throughout the day.  Protect yourself by wearing long sleeves, pants, a wide-brimmed hat, and sunglasses when outside.  What should I know about heart disease, diabetes, and high blood pressure?  If you are 52-60 years of age, have your blood pressure checked every 3-5 years. If you are 82 years of age or older, have your blood pressure checked every year. You should have your blood pressure measured twice-once when you are at  a hospital or clinic, and once when you are not at a hospital or clinic. Record the average of the two measurements. To check your blood pressure when you are not at a hospital or clinic, you can use: ? An automated blood pressure machine at a pharmacy. ? A home blood pressure monitor.  Talk to your health care provider about your target blood pressure.  If you are between 23-26 years old, ask your health care provider if you should take aspirin to prevent heart disease.  Have regular diabetes screenings by checking your fasting blood sugar level. ? If you are at a normal weight and have a low risk for diabetes, have this test once every three years after the age of 71. ? If you are overweight and have a high risk for diabetes, consider being tested at a younger age or more often.  A one-time screening for abdominal aortic aneurysm (AAA) by ultrasound is recommended for men aged 71-75 years who are current or former smokers. What should I know about preventing infection? Hepatitis B If you have a higher risk for hepatitis B, you should be screened for this virus. Talk with your health care provider to find out if you are at risk for hepatitis B infection. Hepatitis C Blood testing is recommended for:  Everyone born from [birth year] through 1965.  Anyone with known risk factors for hepatitis C.  Sexually Transmitted Diseases (STDs)  You should be screened each year for STDs including gonorrhea and chlamydia if: ? You are sexually active and are younger than 71 years of age. ? You are older than 71 years of age and your health care provider tells you that you are at risk for this type of infection. ? Your sexual activity has changed since you were last screened and you are at an increased risk for chlamydia or gonorrhea. Ask your health care provider if you are at risk.  Talk with your health care provider about whether you are at high risk of being infected with HIV. Your health care provider  may recommend a prescription medicine to help prevent HIV infection.  What else can I do?  Schedule regular health, dental, and eye exams.  Stay current with your vaccines (immunizations).  Do not use any tobacco products, such as cigarettes, chewing tobacco, and e-cigarettes. If you need help quitting, ask your health care provider.  Limit alcohol intake to no more than 2 drinks per day. One drink equals 12 ounces of beer, 5 ounces of wine, or 1 ounces of hard liquor.  Do not use street drugs.  Do not share needles.  Ask your health care provider for help if you need support or information about quitting drugs.  Tell your health care provider if you often feel depressed.  Tell your health care provider if you have ever been abused or do not feel safe at home. This information is not intended to replace advice given to you by your health care provider.  Make sure you discuss any questions you have with your health care provider. Document Released: 03/27/2008 Document Revised: 05/28/2016 Document Reviewed: 07/03/2015 Elsevier Interactive Patient Education  [employer].

## 2017-11-10 NOTE — Assessment & Plan Note (Signed)
Patient has a life expectancy longer than typical male, and fam hx; willing to get PSA today and will bill Medicare

## 2017-11-10 NOTE — Assessment & Plan Note (Signed)
Check TSH today

## 2017-11-10 NOTE — Assessment & Plan Note (Signed)
Discussed new age guidelines, 41-69; offered PSA if he feels strongly since his longevity is expected to surpass typical male

## 2017-11-10 NOTE — Assessment & Plan Note (Signed)
nonfasting today (had sugar in coffee); check A1c

## 2017-11-10 NOTE — Assessment & Plan Note (Signed)
Check fasting lipids today 

## 2017-11-11 DIAGNOSIS — H25041 Posterior subcapsular polar age-related cataract, right eye: Secondary | ICD-10-CM | POA: Diagnosis not present

## 2017-11-11 LAB — COMPLETE METABOLIC PANEL WITH GFR
AG Ratio: 1.6 (calc) (ref 1.0–2.5)
ALT: 25 U/L (ref 9–46)
AST: 28 U/L (ref 10–35)
Albumin: 4.5 g/dL (ref 3.6–5.1)
Alkaline phosphatase (APISO): 86 U/L (ref 40–115)
BUN: 13 mg/dL (ref 7–25)
CHLORIDE: 104 mmol/L (ref 98–110)
CO2: 28 mmol/L (ref 20–32)
Calcium: 9.1 mg/dL (ref 8.6–10.3)
Creat: 1.02 mg/dL (ref 0.70–1.18)
GFR, Est African American: 86 mL/min/{1.73_m2} (ref >=60)
GFR, Est Non African American: 74 mL/min/{1.73_m2} (ref >=60)
GLUCOSE: 105 mg/dL (ref 65–139)
Globulin: 2.9 g/dL (calc) (ref 1.9–3.7)
POTASSIUM: 4.6 mmol/L (ref 3.5–5.3)
Sodium: 140 mmol/L (ref 135–146)
Total Bilirubin: 0.8 mg/dL (ref 0.2–1.2)
Total Protein: 7.4 g/dL (ref 6.1–8.1)

## 2017-11-11 LAB — LIPID PANEL
CHOLESTEROL: 220 mg/dL — AB (ref <200)
HDL: 58 mg/dL (ref >40)
LDL CHOLESTEROL (CALC): 143 mg/dL — AB
Non-HDL Cholesterol (Calc): 162 mg/dL (calc) — ABNORMAL HIGH (ref <130)
Total CHOL/HDL Ratio: 3.8 (calc) (ref <5.0)
Triglycerides: 85 mg/dL (ref <150)

## 2017-11-11 LAB — CBC WITH DIFFERENTIAL/PLATELET
BASOS ABS: 40 {cells}/uL (ref 0–200)
Basophils Relative: 1 %
Eosinophils Absolute: 140 cells/uL (ref 15–500)
Eosinophils Relative: 3.5 %
HCT: 45.4 % (ref 38.5–50.0)
Hemoglobin: 15.9 g/dL (ref 13.2–17.1)
Lymphs Abs: 1056 cells/uL (ref 850–3900)
MCH: 33 pg (ref 27.0–33.0)
MCHC: 35 g/dL (ref 32.0–36.0)
MCV: 94.2 fL (ref 80.0–100.0)
MPV: 10 fL (ref 7.5–12.5)
Monocytes Relative: 10 %
Neutro Abs: 2364 cells/uL (ref 1500–7800)
Neutrophils Relative %: 59.1 %
Platelets: 248 10*3/uL (ref 140–400)
RBC: 4.82 10*6/uL (ref 4.20–5.80)
RDW: 13 % (ref 11.0–15.0)
TOTAL LYMPHOCYTE: 26.4 %
WBC mixed population: 400 cells/uL (ref 200–950)
WBC: 4 10*3/uL (ref 3.8–10.8)

## 2017-11-11 LAB — HEMOGLOBIN A1C
HEMOGLOBIN A1C: 5.6 %{Hb} (ref <5.7)
MEAN PLASMA GLUCOSE: 114 (calc)
eAG (mmol/L): 6.3 (calc)

## 2017-11-11 LAB — TSH: TSH: 4.59 mIU/L — ABNORMAL HIGH (ref 0.40–4.50)

## 2017-11-11 LAB — PSA: PSA: 1 ng/mL (ref <=4.0)

## 2017-11-16 ENCOUNTER — Other Ambulatory Visit: Payer: Self-pay | Admitting: Family Medicine

## 2017-11-16 DIAGNOSIS — E034 Atrophy of thyroid (acquired): Secondary | ICD-10-CM

## 2017-11-16 DIAGNOSIS — E782 Mixed hyperlipidemia: Secondary | ICD-10-CM

## 2017-11-16 MED ORDER — LEVOTHYROXINE SODIUM 125 MCG PO TABS: 125.0000 ug | ORAL_TABLET | Freq: Every day | ORAL | 0 refills | Status: DC

## 2017-11-16 NOTE — Progress Notes (Signed)
Increase thyroid med; recheck TSH in 6-8 weeks Recommended statin, waiting for patient's response PSA has bumped up; offered to recheck PSA in 3-6 months vs referral to urologist

## 2017-11-17 ENCOUNTER — Encounter: Payer: Self-pay | Admitting: *Deleted

## 2017-11-21 ENCOUNTER — Encounter: Payer: Self-pay | Admitting: Family Medicine

## 2017-11-23 MED ORDER — ATORVASTATIN CALCIUM 20 MG PO TABS: 20.0000 mg | ORAL_TABLET | Freq: Every day | ORAL | 0 refills | Status: DC

## 2017-11-24 ENCOUNTER — Ambulatory Visit: Payer: Medicare Other | Admitting: Certified Registered Nurse Anesthetist

## 2017-11-24 ENCOUNTER — Encounter
Admission: RE | Disposition: A | Discharge status: Home / Self Care | Payer: Self-pay | Source: Ambulatory Visit | Attending: Ophthalmology

## 2017-11-24 ENCOUNTER — Encounter: Payer: Self-pay | Admitting: Emergency Medicine

## 2017-11-24 ENCOUNTER — Ambulatory Visit
Admission: RE | Admit: 2017-11-24 | Discharge: 2017-11-24 | Disposition: A | Discharge status: Home / Self Care | Payer: Medicare Other | Source: Ambulatory Visit | Attending: Ophthalmology | Admitting: Ophthalmology

## 2017-11-24 DIAGNOSIS — H25041 Posterior subcapsular polar age-related cataract, right eye: Secondary | ICD-10-CM | POA: Diagnosis not present

## 2017-11-24 DIAGNOSIS — Z79899 Other long term (current) drug therapy: Secondary | ICD-10-CM | POA: Insufficient documentation

## 2017-11-24 DIAGNOSIS — H2511 Age-related nuclear cataract, right eye: Secondary | ICD-10-CM | POA: Diagnosis not present

## 2017-11-24 DIAGNOSIS — E039 Hypothyroidism, unspecified: Secondary | ICD-10-CM | POA: Insufficient documentation

## 2017-11-24 DIAGNOSIS — Z7982 Long term (current) use of aspirin: Secondary | ICD-10-CM | POA: Insufficient documentation

## 2017-11-24 DIAGNOSIS — Z87891 Personal history of nicotine dependence: Secondary | ICD-10-CM | POA: Diagnosis not present

## 2017-11-24 DIAGNOSIS — K589 Irritable bowel syndrome without diarrhea: Secondary | ICD-10-CM | POA: Diagnosis not present

## 2017-11-24 HISTORY — PX: CATARACT EXTRACTION W/PHACO: SHX586

## 2017-11-24 HISTORY — DX: Hypothyroidism, unspecified: E03.9

## 2017-11-24 SURGERY — PHACOEMULSIFICATION, CATARACT, WITH IOL INSERTION
Anesthesia: Monitor Anesthesia Care | Site: Eye | Laterality: Right | Wound class: Clean

## 2017-11-24 MED ORDER — LIDOCAINE HCL (PF) 4 % IJ SOLN
INTRAMUSCULAR | Status: AC
  Filled 2017-11-24: qty 5

## 2017-11-24 MED ORDER — NA CHONDROIT SULF-NA HYALURON 40-17 MG/ML IO SOLN
INTRAOCULAR | Status: DC | PRN
  Administered 2017-11-24: 1 mL via INTRAOCULAR

## 2017-11-24 MED ORDER — POVIDONE-IODINE 5 % OP SOLN
OPHTHALMIC | Status: AC
  Filled 2017-11-24: qty 30

## 2017-11-24 MED ORDER — FENTANYL CITRATE (PF) 100 MCG/2ML IJ SOLN
INTRAMUSCULAR | Status: DC | PRN
  Administered 2017-11-24: 25 ug via INTRAVENOUS
  Administered 2017-11-24: 75 ug via INTRAVENOUS

## 2017-11-24 MED ORDER — CARBACHOL 0.01 % IO SOLN
INTRAOCULAR | Status: DC | PRN
  Administered 2017-11-24: 0.5 mL via INTRAOCULAR

## 2017-11-24 MED ORDER — BSS IO SOLN
INTRAOCULAR | Status: DC | PRN
  Administered 2017-11-24: 11:00:00 via OPHTHALMIC

## 2017-11-24 MED ORDER — EPINEPHRINE PF 1 MG/ML IJ SOLN
INTRAMUSCULAR | Status: AC
  Filled 2017-11-24: qty 2

## 2017-11-24 MED ORDER — MOXIFLOXACIN HCL 0.5 % OP SOLN
OPHTHALMIC | Status: DC | PRN
  Administered 2017-11-24: 0.2 mL via OPHTHALMIC

## 2017-11-24 MED ORDER — FENTANYL CITRATE (PF) 100 MCG/2ML IJ SOLN
INTRAMUSCULAR | Status: AC
  Filled 2017-11-24: qty 2

## 2017-11-24 MED ORDER — ARMC OPHTHALMIC DILATING DROPS
1.0000 "application " | OPHTHALMIC | Status: AC
  Administered 2017-11-24 (×3): 1 via OPHTHALMIC

## 2017-11-24 MED ORDER — SODIUM CHLORIDE 0.9 % IV SOLN
INTRAVENOUS | Status: DC
  Administered 2017-11-24: 10:00:00 via INTRAVENOUS

## 2017-11-24 MED ORDER — MOXIFLOXACIN HCL 0.5 % OP SOLN
OPHTHALMIC | Status: AC
  Filled 2017-11-24: qty 3

## 2017-11-24 MED ORDER — POVIDONE-IODINE 5 % OP SOLN
OPHTHALMIC | Status: DC | PRN
  Administered 2017-11-24: 1 via OPHTHALMIC

## 2017-11-24 MED ORDER — NA CHONDROIT SULF-NA HYALURON 40-17 MG/ML IO SOLN
INTRAOCULAR | Status: AC
  Filled 2017-11-24: qty 1

## 2017-11-24 MED ORDER — ARMC OPHTHALMIC DILATING DROPS
OPHTHALMIC | Status: AC
  Administered 2017-11-24: 1 via OPHTHALMIC
  Filled 2017-11-24: qty 0.4

## 2017-11-24 MED ORDER — BSS IO SOLN
INTRAOCULAR | Status: DC | PRN
  Administered 2017-11-24: 4 mL via OPHTHALMIC

## 2017-11-24 MED ORDER — MOXIFLOXACIN HCL 0.5 % OP SOLN
1.0000 [drp] | OPHTHALMIC | Status: DC | PRN
Start: 2017-11-24 — End: 2017-11-24

## 2017-11-24 SURGICAL SUPPLY — 16 items
GLOVE BIO SURGEON STRL SZ8 (GLOVE) ×3 IMPLANT
GLOVE BIOGEL M 6.5 STRL (GLOVE) ×3 IMPLANT
GLOVE SURG LX 8.0 MICRO (GLOVE) ×2
GLOVE SURG LX STRL 8.0 MICRO (GLOVE) ×1 IMPLANT
GOWN STRL REUS W/ TWL LRG LVL3 (GOWN DISPOSABLE) ×2 IMPLANT
GOWN STRL REUS W/TWL LRG LVL3 (GOWN DISPOSABLE) ×4
LABEL CATARACT MEDS ST (LABEL) ×3 IMPLANT
LENS IOL TECNIS ITEC 23.5 (Intraocular Lens) ×3 IMPLANT
PACK CATARACT (MISCELLANEOUS) ×3 IMPLANT
PACK CATARACT BRASINGTON LX (MISCELLANEOUS) ×3 IMPLANT
PACK EYE AFTER SURG (MISCELLANEOUS) ×3 IMPLANT
SOL BSS BAG (MISCELLANEOUS) ×3
SOLUTION BSS BAG (MISCELLANEOUS) ×1 IMPLANT
SYR 5ML LL (SYRINGE) ×3 IMPLANT
WATER STERILE IRR 250ML POUR (IV SOLUTION) ×3 IMPLANT
WIPE NON LINTING 3.25X3.25 (MISCELLANEOUS) ×3 IMPLANT

## 2017-11-24 NOTE — Anesthesia Postprocedure Evaluation (Signed)
Anesthesia Post Note  Patient: Marc Jackson  Procedure(s) Performed: CATARACT EXTRACTION PHACO AND INTRAOCULAR LENS PLACEMENT (IOC) (Right Eye)  Patient location during evaluation: PACU Anesthesia Type: MAC Level of consciousness: awake and alert and oriented Pain management: pain level controlled Vital Signs Assessment: post-procedure vital signs reviewed and stable Respiratory status: spontaneous breathing, nonlabored ventilation and respiratory function stable Cardiovascular status: blood pressure returned to baseline and stable Postop Assessment: no signs of nausea or vomiting Anesthetic complications: no     Last Vitals:  Vitals:   11/24/17 1129 11/24/17 1145  BP: 130/82 123/62  Pulse: 60 64  Resp: 12 12  Temp: (!) 36.3 C   SpO2: 95% 95%    Last Pain:  Vitals:   11/24/17 1129  TempSrc: Temporal                 [name]

## 2017-11-24 NOTE — Anesthesia Procedure Notes (Signed)
Procedure Name: MAC Performed by: Girard Koontz, CRNA Pre-anesthesia Checklist: Patient identified, Suction available, Emergency Drugs available, Patient being monitored and Timeout performed Oxygen Delivery Method: Nasal cannula       

## 2017-11-24 NOTE — Anesthesia Post-op Follow-up Note (Signed)
Anesthesia QCDR form completed.        

## 2017-11-24 NOTE — Anesthesia Preprocedure Evaluation (Signed)
Anesthesia Evaluation  Patient identified by MRN, date of birth, ID band Patient awake    Reviewed: Allergy & Precautions, NPO status , Patient's Chart, lab work & pertinent test results  History of Anesthesia Complications Negative for: history of anesthetic complications  Airway Mallampati: II  TM Distance: >3 FB Neck ROM: Full    Dental no notable dental hx.    Pulmonary neg sleep apnea, neg COPD, former smoker,    breath sounds clear to auscultation- rhonchi (-) wheezing      Cardiovascular Exercise Tolerance: Good (-) hypertension(-) CAD, (-) Past MI, (-) Cardiac Stents and (-) CABG  Rhythm:Regular Rate:Normal - Systolic murmurs and - Diastolic murmurs    Neuro/Psych negative neurological ROS  negative psych ROS   GI/Hepatic negative GI ROS, Neg liver ROS,   Endo/Other  neg diabetesHypothyroidism   Renal/GU negative Renal ROS     Musculoskeletal negative musculoskeletal ROS (+)   Abdominal (+) - obese,   Peds  Hematology negative hematology ROS (+)   Anesthesia Other Findings Past Medical History: No date: Cataract No date: Hypothyroidism No date: IBS (irritable bowel syndrome) No date: Thyroid disease   Reproductive/Obstetrics                             Anesthesia Physical Anesthesia Plan  ASA: II  Anesthesia Plan: MAC   Post-op Pain Management:    Induction: Intravenous  PONV Risk Score and Plan: 1 and Midazolam  Airway Management Planned: Natural Airway  Additional Equipment:   Intra-op Plan:   Post-operative Plan:   Informed Consent: I have reviewed the patients History and Physical, chart, labs and discussed the procedure including the risks, benefits and alternatives for the proposed anesthesia with the patient or authorized representative who has indicated his/her understanding and acceptance.     Plan Discussed with: CRNA and  Anesthesiologist  Anesthesia Plan Comments:         Anesthesia Quick Evaluation

## 2017-11-24 NOTE — Op Note (Signed)
PREOPERATIVE DIAGNOSIS:  Nuclear sclerotic cataract of the right eye.   POSTOPERATIVE DIAGNOSIS:  NUCLEAR SCLEROTIC CATARACT RIGHT EYE   OPERATIVE PROCEDURE: Procedure(s): CATARACT EXTRACTION PHACO AND INTRAOCULAR LENS PLACEMENT (IOC)   SURGEON:  Birder Robson, MD.   ANESTHESIA:  Anesthesiologist: Emmie Niemann, MD CRNA: Demetrius Charity, CRNA  1.      Managed anesthesia care. 2.      0.10ml of Shugarcaine was instilled in the eye following the paracentesis.   COMPLICATIONS:  None.   TECHNIQUE:   Stop and chop   DESCRIPTION OF PROCEDURE:  The patient was examined and consented in the preoperative holding area where the aforementioned topical anesthesia was applied to the right eye and then brought back to the Operating Room where the right eye was prepped and draped in the usual sterile ophthalmic fashion and a lid speculum was placed. A paracentesis was created with the side port blade and the anterior chamber was filled with viscoelastic. A near clear corneal incision was performed with the steel keratome. A continuous curvilinear capsulorrhexis was performed with a cystotome followed by the capsulorrhexis forceps. Hydrodissection and hydrodelineation were carried out with BSS on a blunt cannula. The lens was removed in a stop and chop  technique and the remaining cortical material was removed with the irrigation-aspiration handpiece. The capsular bag was inflated with viscoelastic and the Technis [id number]  lens was placed in the capsular bag without complication. The remaining viscoelastic was removed from the eye with the irrigation-aspiration handpiece. The wounds were hydrated. The anterior chamber was flushed with Miostat and the eye was inflated to physiologic pressure. 0.47ml of Vigamox was placed in the anterior chamber. The wounds were found to be water tight. The eye was dressed with Vigamox. The patient was given protective glasses to wear throughout the day and a shield with which  to sleep tonight. The patient was also given drops with which to begin a drop regimen today and will follow-up with me in one day. Implant Name Type Inv. Item Serial No. Manufacturer Lot No. LRB No. Used  LENS IOL DIOP 23.5 - [id number] Intraocular Lens LENS IOL DIOP 23.5 [id number] AMO  Right 1   Procedure(s) with comments: CATARACT EXTRACTION PHACO AND INTRAOCULAR LENS PLACEMENT (IOC) (Right) - [place] 00:28.3 AP% 14.6 CDE 4.12 Fluid pack Lot # [id number]  Electronically signed: Birder Robson 11/24/2017 11:27 AM

## 2017-11-24 NOTE — H&P (Signed)
All labs reviewed. Abnormal studies sent to patients PCP when indicated.  Previous H&P reviewed, patient examined, there are NO CHANGES.  Marc Prigmore Porfilio2/12/201911:02 AM

## 2017-11-24 NOTE — Transfer of Care (Signed)
Immediate Anesthesia Transfer of Care Note  Patient: Marc Jackson  Procedure(s) Performed: CATARACT EXTRACTION PHACO AND INTRAOCULAR LENS PLACEMENT (IOC) (Right Eye)  Patient Location: PACU  Anesthesia Type:MAC  Level of Consciousness: awake, alert  and oriented  Airway & Oxygen Therapy: Patient Spontanous Breathing  Post-op Assessment: Report given to RN and Post -op Vital signs reviewed and stable  Post vital signs: Reviewed and stable  Last Vitals:  Vitals:   11/24/17 1006  BP: 117/75  Pulse: 64  Resp: 17  Temp: 36.7 C  SpO2: 96%    Last Pain:  Vitals:   11/24/17 1006  TempSrc: Oral         Complications: No apparent anesthesia complications

## 2017-11-24 NOTE — Discharge Instructions (Signed)
Eye Surgery Discharge Instructions  Expect mild scratchy sensation or mild soreness. DO NOT RUB YOUR EYE!  The day of surgery:  Minimal physical activity, but bed rest is not required  No reading, computer work, or close hand work  No bending, lifting, or straining.  May watch TV  For 24 hours:  No driving, legal decisions, or alcoholic beverages  Safety precautions  Eat anything you prefer: It is better to start with liquids, then soup then solid foods.  _____ Eye patch should be worn until postoperative exam tomorrow.  ____ Solar shield eyeglasses should be worn for comfort in the sunlight/patch while sleeping  Resume all regular medications including aspirin or Coumadin if these were discontinued prior to surgery. You may shower, bathe, shave, or wash your hair. Tylenol may be taken for mild discomfort.  Call your doctor if you experience significant pain, nausea, or vomiting, fever > 101 or other signs of infection. [phone] or [phone] Specific instructions:  Follow-up Information    Birder Robson, MD Follow up on 11/25/2017.   Specialty:  Ophthalmology Why:  8:10 am Contact information: [address] [place] [address] [phone]

## 2017-12-15 DIAGNOSIS — H2512 Age-related nuclear cataract, left eye: Secondary | ICD-10-CM | POA: Diagnosis not present

## 2017-12-22 ENCOUNTER — Encounter: Payer: Self-pay | Admitting: *Deleted

## 2017-12-29 ENCOUNTER — Ambulatory Visit: Payer: Medicare Other | Admitting: Certified Registered Nurse Anesthetist

## 2017-12-29 ENCOUNTER — Ambulatory Visit
Admission: RE | Admit: 2017-12-29 | Discharge: 2017-12-29 | Disposition: A | Discharge status: Home / Self Care | Payer: Medicare Other | Source: Ambulatory Visit | Attending: Ophthalmology | Admitting: Ophthalmology

## 2017-12-29 ENCOUNTER — Other Ambulatory Visit: Payer: Self-pay

## 2017-12-29 ENCOUNTER — Encounter: Payer: Self-pay | Admitting: *Deleted

## 2017-12-29 ENCOUNTER — Encounter
Admission: RE | Disposition: A | Discharge status: Home / Self Care | Payer: Self-pay | Source: Ambulatory Visit | Attending: Ophthalmology

## 2017-12-29 DIAGNOSIS — Z880 Allergy status to penicillin: Secondary | ICD-10-CM | POA: Insufficient documentation

## 2017-12-29 DIAGNOSIS — K589 Irritable bowel syndrome without diarrhea: Secondary | ICD-10-CM | POA: Insufficient documentation

## 2017-12-29 DIAGNOSIS — Z85828 Personal history of other malignant neoplasm of skin: Secondary | ICD-10-CM | POA: Insufficient documentation

## 2017-12-29 DIAGNOSIS — E7800 Pure hypercholesterolemia, unspecified: Secondary | ICD-10-CM | POA: Insufficient documentation

## 2017-12-29 DIAGNOSIS — Z87891 Personal history of nicotine dependence: Secondary | ICD-10-CM | POA: Insufficient documentation

## 2017-12-29 DIAGNOSIS — E039 Hypothyroidism, unspecified: Secondary | ICD-10-CM | POA: Insufficient documentation

## 2017-12-29 DIAGNOSIS — Z79899 Other long term (current) drug therapy: Secondary | ICD-10-CM | POA: Insufficient documentation

## 2017-12-29 DIAGNOSIS — Z7982 Long term (current) use of aspirin: Secondary | ICD-10-CM | POA: Insufficient documentation

## 2017-12-29 DIAGNOSIS — Z887 Allergy status to serum and vaccine status: Secondary | ICD-10-CM | POA: Insufficient documentation

## 2017-12-29 DIAGNOSIS — H2512 Age-related nuclear cataract, left eye: Secondary | ICD-10-CM | POA: Insufficient documentation

## 2017-12-29 HISTORY — DX: Malignant (primary) neoplasm, unspecified: C80.1

## 2017-12-29 HISTORY — PX: CATARACT EXTRACTION W/PHACO: SHX586

## 2017-12-29 SURGERY — PHACOEMULSIFICATION, CATARACT, WITH IOL INSERTION
Anesthesia: Monitor Anesthesia Care | Site: Eye | Laterality: Left | Wound class: Clean

## 2017-12-29 MED ORDER — ARMC OPHTHALMIC DILATING DROPS
1.0000 "application " | OPHTHALMIC | Status: AC
  Administered 2017-12-29 (×3): 1 via OPHTHALMIC

## 2017-12-29 MED ORDER — MOXIFLOXACIN HCL 0.5 % OP SOLN: 1.0000 [drp] | OPHTHALMIC | Status: DC | PRN

## 2017-12-29 MED ORDER — POVIDONE-IODINE 5 % OP SOLN
OPHTHALMIC | Status: DC | PRN
  Administered 2017-12-29: 1 via OPHTHALMIC

## 2017-12-29 MED ORDER — LIDOCAINE HCL (PF) 4 % IJ SOLN
INTRAOCULAR | Status: DC | PRN
  Administered 2017-12-29: 4 mL via OPHTHALMIC

## 2017-12-29 MED ORDER — EPINEPHRINE PF 1 MG/ML IJ SOLN
INTRAOCULAR | Status: DC | PRN
  Administered 2017-12-29: 11:00:00 via OPHTHALMIC

## 2017-12-29 MED ORDER — FENTANYL CITRATE (PF) 100 MCG/2ML IJ SOLN
INTRAMUSCULAR | Status: DC | PRN
  Administered 2017-12-29 (×2): 50 ug via INTRAVENOUS

## 2017-12-29 MED ORDER — LIDOCAINE HCL (PF) 4 % IJ SOLN
INTRAMUSCULAR | Status: AC
  Filled 2017-12-29: qty 5

## 2017-12-29 MED ORDER — EPINEPHRINE PF 1 MG/ML IJ SOLN
INTRAMUSCULAR | Status: AC
  Filled 2017-12-29: qty 2

## 2017-12-29 MED ORDER — MOXIFLOXACIN HCL 0.5 % OP SOLN
OPHTHALMIC | Status: DC | PRN
  Administered 2017-12-29: 0.2 mL via OPHTHALMIC

## 2017-12-29 MED ORDER — POVIDONE-IODINE 5 % OP SOLN
OPHTHALMIC | Status: AC
  Filled 2017-12-29: qty 30

## 2017-12-29 MED ORDER — NA CHONDROIT SULF-NA HYALURON 40-17 MG/ML IO SOLN
INTRAOCULAR | Status: AC
  Filled 2017-12-29: qty 1

## 2017-12-29 MED ORDER — SODIUM CHLORIDE 0.9 % IV SOLN
INTRAVENOUS | Status: DC
  Administered 2017-12-29: 11:00:00 via INTRAVENOUS

## 2017-12-29 MED ORDER — CARBACHOL 0.01 % IO SOLN
INTRAOCULAR | Status: DC | PRN
  Administered 2017-12-29: 0.5 mL via INTRAOCULAR

## 2017-12-29 MED ORDER — NA CHONDROIT SULF-NA HYALURON 40-17 MG/ML IO SOLN
INTRAOCULAR | Status: DC | PRN
  Administered 2017-12-29: 1 mL via INTRAOCULAR

## 2017-12-29 MED ORDER — FENTANYL CITRATE (PF) 100 MCG/2ML IJ SOLN
INTRAMUSCULAR | Status: AC
  Filled 2017-12-29: qty 2

## 2017-12-29 SURGICAL SUPPLY — 16 items
GLOVE BIO SURGEON STRL SZ8 (GLOVE) ×3 IMPLANT
GLOVE BIOGEL M 6.5 STRL (GLOVE) ×3 IMPLANT
GLOVE SURG LX 8.0 MICRO (GLOVE) ×2
GLOVE SURG LX STRL 8.0 MICRO (GLOVE) ×1 IMPLANT
GOWN STRL REUS W/ TWL LRG LVL3 (GOWN DISPOSABLE) ×2 IMPLANT
GOWN STRL REUS W/TWL LRG LVL3 (GOWN DISPOSABLE) ×4
LABEL CATARACT MEDS ST (LABEL) ×3 IMPLANT
LENS IOL TECNIS ITEC 23.0 (Intraocular Lens) ×3 IMPLANT
PACK CATARACT (MISCELLANEOUS) ×3 IMPLANT
PACK CATARACT BRASINGTON LX (MISCELLANEOUS) ×3 IMPLANT
PACK EYE AFTER SURG (MISCELLANEOUS) ×3 IMPLANT
SOL BSS BAG (MISCELLANEOUS) ×3
SOLUTION BSS BAG (MISCELLANEOUS) ×1 IMPLANT
SYR 5ML LL (SYRINGE) ×3 IMPLANT
WATER STERILE IRR 250ML POUR (IV SOLUTION) ×3 IMPLANT
WIPE NON LINTING 3.25X3.25 (MISCELLANEOUS) ×3 IMPLANT

## 2017-12-29 NOTE — Anesthesia Preprocedure Evaluation (Signed)
Anesthesia Evaluation  Patient identified by MRN, date of birth, ID band Patient awake    Reviewed: Allergy & Precautions, NPO status , Patient's Chart, lab work & pertinent test results  History of Anesthesia Complications Negative for: history of anesthetic complications  Airway Mallampati: II  TM Distance: >3 FB Neck ROM: Full    Dental no notable dental hx.    Pulmonary neg sleep apnea, neg COPD, former smoker,    breath sounds clear to auscultation- rhonchi (-) wheezing      Cardiovascular Exercise Tolerance: Good (-) hypertension(-) CAD, (-) Past MI, (-) Cardiac Stents and (-) CABG  Rhythm:Regular Rate:Normal - Systolic murmurs and - Diastolic murmurs    Neuro/Psych negative neurological ROS  negative psych ROS   GI/Hepatic negative GI ROS, Neg liver ROS,   Endo/Other  neg diabetesHypothyroidism   Renal/GU negative Renal ROS     Musculoskeletal negative musculoskeletal ROS (+)   Abdominal (+) - obese,   Peds  Hematology negative hematology ROS (+)   Anesthesia Other Findings Past Medical History: No date: Cataract No date: Hypothyroidism No date: IBS (irritable bowel syndrome) No date: Thyroid disease   Reproductive/Obstetrics                             Anesthesia Physical Anesthesia Plan  ASA: II  Anesthesia Plan: MAC   Post-op Pain Management:    Induction: Intravenous  PONV Risk Score and Plan: 1 and Midazolam  Airway Management Planned: Natural Airway  Additional Equipment:   Intra-op Plan:   Post-operative Plan:   Informed Consent: I have reviewed the patients History and Physical, chart, labs and discussed the procedure including the risks, benefits and alternatives for the proposed anesthesia with the patient or authorized representative who has indicated his/her understanding and acceptance.     Plan Discussed with: CRNA and  Anesthesiologist  Anesthesia Plan Comments:         Anesthesia Quick Evaluation  

## 2017-12-29 NOTE — Op Note (Signed)
PREOPERATIVE DIAGNOSIS:  Nuclear sclerotic cataract of the left eye.   POSTOPERATIVE DIAGNOSIS:  Nuclear sclerotic cataract of the left eye.   OPERATIVE PROCEDURE: Procedure(s): CATARACT EXTRACTION PHACO AND INTRAOCULAR LENS PLACEMENT (IOC)   SURGEON:  Birder Robson, MD.   ANESTHESIA:  Anesthesiologist: Emmie Niemann, MD CRNA: Eben Burow, CRNA  1.      Managed anesthesia care. 2.     0.66ml of Shugarcaine was instilled following the paracentesis   COMPLICATIONS:  None.   TECHNIQUE:   Stop and chop   DESCRIPTION OF PROCEDURE:  The patient was examined and consented in the preoperative holding area where the aforementioned topical anesthesia was applied to the left eye and then brought back to the Operating Room where the left eye was prepped and draped in the usual sterile ophthalmic fashion and a lid speculum was placed. A paracentesis was created with the side port blade and the anterior chamber was filled with viscoelastic. A near clear corneal incision was performed with the steel keratome. A continuous curvilinear capsulorrhexis was performed with a cystotome followed by the capsulorrhexis forceps. Hydrodissection and hydrodelineation were carried out with BSS on a blunt cannula. The lens was removed in a stop and chop  technique and the remaining cortical material was removed with the irrigation-aspiration handpiece. The capsular bag was inflated with viscoelastic and the Technis [id number] lens was placed in the capsular bag without complication. The remaining viscoelastic was removed from the eye with the irrigation-aspiration handpiece. The wounds were hydrated. The anterior chamber was flushed with Miostat and the eye was inflated to physiologic pressure. 0.58ml Vigamox was placed in the anterior chamber. The wounds were found to be water tight. The eye was dressed with Vigamox. The patient was given protective glasses to wear throughout the day and a shield with which to sleep  tonight. The patient was also given drops with which to begin a drop regimen today and will follow-up with me in one day. Implant Name Type Inv. Item Serial No. Manufacturer Lot No. LRB No. Used  LENS IOL DIOP 23.0 - [id number] Intraocular Lens LENS IOL DIOP 23.0 [id number] AMO  Left 1    Procedure(s) with comments: CATARACT EXTRACTION PHACO AND INTRAOCULAR LENS PLACEMENT (IOC) (Left) - [place] 00:30.4 AP% 14.0 CDE 4.25 Fluid Pack Lot # [id number]  Electronically signed: Birder Robson 12/29/2017 11:39 AM

## 2017-12-29 NOTE — Discharge Instructions (Signed)
FOLLOW DR. PORFILIO'S POSTOP EYE DROP INSTRUCTION SHEET AS REVIEWED.  Eye Surgery Discharge Instructions  Expect mild scratchy sensation or mild soreness. DO NOT RUB YOUR EYE!  The day of surgery:  Minimal physical activity, but bed rest is not required  No reading, computer work, or close hand work  No bending, lifting, or straining.  May watch TV  For 24 hours:  No driving, legal decisions, or alcoholic beverages  Safety precautions  Eat anything you prefer: It is better to start with liquids, then soup then solid foods.  _____ Eye patch should be worn until postoperative exam tomorrow.  ____ Solar shield eyeglasses should be worn for comfort in the sunlight/patch while sleeping  Resume all regular medications including aspirin or Coumadin if these were discontinued prior to surgery. You may shower, bathe, shave, or wash your hair. Tylenol may be taken for mild discomfort.  Call your doctor if you experience significant pain, nausea, or vomiting, fever > 101 or other signs of infection. [phone] or [phone] Specific instructions:  Follow-up Information    Birder Robson, MD Follow up.   Specialty:  Ophthalmology Why:  Wednesday 12/30/17 @ 8:10 am Contact information: [place] [id number] [phone]

## 2017-12-29 NOTE — Anesthesia Postprocedure Evaluation (Signed)
Anesthesia Post Note  Patient: MANSOUR BALBOA  Procedure(s) Performed: CATARACT EXTRACTION PHACO AND INTRAOCULAR LENS PLACEMENT (IOC) (Left Eye)  Patient location during evaluation: PACU Anesthesia Type: MAC Level of consciousness: awake and alert and oriented Pain management: pain level controlled Vital Signs Assessment: post-procedure vital signs reviewed and stable Respiratory status: spontaneous breathing, nonlabored ventilation and respiratory function stable Cardiovascular status: blood pressure returned to baseline and stable Postop Assessment: no signs of nausea or vomiting Anesthetic complications: no     Last Vitals:  Vitals:   12/29/17 1031 12/29/17 1141  BP: 126/72 131/66  Pulse: 72 67  Resp: 16 16  Temp: 36.8 C 36.8 C  SpO2: 96% 97%    Last Pain:  Vitals:   12/29/17 1141  TempSrc: Tympanic  PainSc:                  Spero Gunnels

## 2017-12-29 NOTE — Transfer of Care (Signed)
Immediate Anesthesia Transfer of Care Note  Patient: Marc Jackson  Procedure(s) Performed: CATARACT EXTRACTION PHACO AND INTRAOCULAR LENS PLACEMENT (IOC) (Left Eye)  Patient Location: Short Stay  Anesthesia Type:MAC  Level of Consciousness: awake, alert , oriented and patient cooperative  Airway & Oxygen Therapy: Patient Spontanous Breathing  Post-op Assessment: Report given to RN and Post -op Vital signs reviewed and stable  Post vital signs: Reviewed and stable  Last Vitals:  Vitals:   12/29/17 1031  BP: 126/72  Pulse: 72  Resp: 16  Temp: 36.8 C  SpO2: 96%    Last Pain:  Vitals:   12/29/17 1031  TempSrc: Tympanic  PainSc: 0-No pain         Complications: No apparent anesthesia complications

## 2017-12-29 NOTE — H&P (Signed)
All labs reviewed. Abnormal studies sent to patients PCP when indicated.  Previous H&P reviewed, patient examined, there are NO CHANGES.  Marc Wayne Porfilio3/19/201911:16 AM

## 2017-12-29 NOTE — Anesthesia Post-op Follow-up Note (Signed)
Anesthesia QCDR form completed.        

## 2018-01-01 NOTE — Progress Notes (Signed)
Cardiology Office Note  Date:  01/04/2018   ID:  Marc Jackson, DOB April 05, 1946, MRN [id number]  PCP:  Marc Courser, MD   Chief Complaint  Patient presents with  . New Patient (Initial Visit)    Referred by Dr. Sanda Jackson for chest pain. Patient states the chest pain comes and goes. Meds reviewed verbally with patient.     HPI:  Marc Jackson is a 72 year old gentleman with past medical history of Prior history of smoking, stopped 1985 GERD Hyperlipidemia Referred by Dr. Sanda Jackson for consultation of his intermittent Atypical chest pain  He reports having remote history of chest pain 2005 Severe pain, went to the emergency room Reports having stress test, EKGs Reports the studies were normal and sent home, no further severe pain  He has had Very light twinges in his chest Not associated with exertion Good exercise tolerance pain lasts just a second;  no jaw pain or neck pain; no left arm pain, no shortness of breath; no nausea  Able to ride a bike for 40-60 miles without significant symptoms  No exercise induced symptoms  Long discussion concerning his family history Mother and father both had CHF;   was a smoker but quit 34 yrs  Normal aorta by ultrasound May 2015 No abdominal aortic aneurysm  Total cholesterol 220, LDL 143 Hemoglobin A1c 5.6 Recently started on Lipitor 20  Previous EKG reviewed November 10, 2017 personally by myself showing normal sinus rhythm no significant ST-T wave changes   PMH:   has a past medical history of Cancer (Marc Jackson), Cataract, Hypothyroidism, IBS (irritable bowel syndrome), and Thyroid disease.  PSH:    Past Surgical History:  Procedure Laterality Date  . BACK SURGERY    . CATARACT EXTRACTION W/PHACO Right 11/24/2017   Procedure: CATARACT EXTRACTION PHACO AND INTRAOCULAR LENS PLACEMENT (IOC);  Surgeon: Birder Robson, MD;  Location: [facility] ORS;  Service: Ophthalmology;  Laterality: Right;  [place] 00:28.3 AP% 14.6 CDE 4.12 Fluid pack Lot # [id number]   . CATARACT EXTRACTION W/PHACO Left 12/29/2017   Procedure: CATARACT EXTRACTION PHACO AND INTRAOCULAR LENS PLACEMENT (IOC);  Surgeon: Birder Robson, MD;  Location: [facility] ORS;  Service: Ophthalmology;  Laterality: Left;  [place] 00:30.4 AP% 14.0 CDE 4.25 Fluid Pack Lot # [id number]  . CO2 LASER OF [place]    . discetomy    . HERNIA REPAIR     X 2  . SKIN CANCER DESTRUCTION      Current Outpatient Medications  Medication Sig Dispense Refill  . aspirin EC 81 MG tablet Take 81 mg by mouth 4 (four) times a week.    Marland Kitchen atorvastatin (LIPITOR) 20 MG tablet Take 1 tablet (20 mg total) by mouth at bedtime. 90 tablet 0  . levothyroxine (SYNTHROID, LEVOTHROID) 125 MCG tablet Take 1 tablet (125 mcg total) by mouth daily. 90 tablet 0   No current facility-administered medications for this visit.      Allergies:   Tetanus toxoids and Penicillins   Social History:  The patient  reports that he has quit smoking. He has never used smokeless tobacco. He reports that he drinks about 16.8 oz of alcohol per week. He reports that he does not use drugs.   Family History:   family history includes Cancer in his father; Diabetes in his brother; Heart disease in his brother, father, and mother; Hyperlipidemia in his mother; Hypothyroidism in his sister; Obesity in his brother.    Review of Systems: Review of Systems  Constitutional: Negative.   Respiratory: Negative.  Cardiovascular: Negative.   Gastrointestinal: Negative.   Musculoskeletal: Negative.   Neurological: Negative.   Psychiatric/Behavioral: Negative.   All other systems reviewed and are negative.    PHYSICAL EXAM: VS:  BP 124/74 (BP Location: Right Arm, Patient Position: Sitting, Cuff Size: Normal)   Pulse (!) 57   Ht 6\' 1"  (1.854 m)   Wt 201 lb (91.2 kg)   BMI 26.52 kg/m  , BMI Body mass index is 26.52 kg/m. GEN: Well nourished, well developed, in no acute distress  HEENT: normal  Neck: no JVD, carotid bruits, or  masses Cardiac: RRR; no murmurs, rubs, or gallops,no edema  Respiratory:  clear to auscultation bilaterally, normal work of breathing GI: soft, nontender, nondistended, + BS MS: no deformity or atrophy  Skin: warm and dry, no rash Neuro:  Strength and sensation are intact Psych: euthymic mood, full affect    Recent Labs: 11/10/2017: ALT 25; BUN 13; Creat 1.02; Hemoglobin 15.9; Platelets 248; Potassium 4.6; Sodium 140; TSH 4.59    Lipid Panel Lab Results  Component Value Date   CHOL 220 (H) 11/10/2017   HDL 58 11/10/2017   LDLCALC 143 (H) 11/10/2017   TRIG 85 11/10/2017      Wt Readings from Last 3 Encounters:  01/04/18 201 lb (91.2 kg)  12/29/17 198 lb (89.8 kg)  11/24/17 194 lb (88 kg)       ASSESSMENT AND PLAN:  Chest pain with moderate risk for cardiac etiology - Plan: CT CARDIAC SCORING Risk factors include smoking until 1985 History of hyperlipidemia until recently started on a statin 1 month ago Cholesterol 220 Good exercise tolerance Family history of congestive heart failure both parents We have recommended CT coronary calcium score for risk stratification  Mixed hyperlipidemia Recently started on Lipitor 20 mg daily, denies any side effects  Prior smoking history Stopped in 1985  Disposition:   F/U as needed   Total encounter time more than 45 minutes  Greater than 50% was spent in counseling and coordination of care with the patient    Orders Placed This Encounter  Procedures  . CT CARDIAC SCORING     Signed, Esmond Plants, M.D., Ph.D. 01/04/2018  [place], [place]

## 2018-01-04 ENCOUNTER — Encounter: Payer: Self-pay | Admitting: Cardiovascular Disease

## 2018-01-04 ENCOUNTER — Ambulatory Visit (INDEPENDENT_AMBULATORY_CARE_PROVIDER_SITE_OTHER): Payer: Medicare Other | Admitting: Cardiovascular Disease

## 2018-01-04 VITALS — BP 124/74 | HR 57 | Ht 73.0 in | Wt 201.0 lb

## 2018-01-04 DIAGNOSIS — R079 Chest pain, unspecified: Secondary | ICD-10-CM

## 2018-01-04 DIAGNOSIS — E782 Mixed hyperlipidemia: Secondary | ICD-10-CM

## 2018-01-04 NOTE — Patient Instructions (Signed)
Medication Instructions:   No medication changes made  Labwork:  No new labs needed  Testing/Procedures:  We will order a CT coronary calcium score Family history   Follow-Up: It was a pleasure seeing you in the office today. Please call us if you have new issues that need to be addressed before your next appt.  [phone]  Your physician wants you to follow-up in:  As needed  If you need a refill on your cardiac medications before your next appointment, please call your pharmacy.  For educational health videos Log in to : [email or web address] Or : [email or web address], password : [email or web address]

## 2018-01-14 ENCOUNTER — Ambulatory Visit (INDEPENDENT_AMBULATORY_CARE_PROVIDER_SITE_OTHER)
Admission: RE | Admit: 2018-01-14 | Discharge: 2018-01-14 | Disposition: A | Discharge status: Home / Self Care | Payer: Self-pay | Source: Ambulatory Visit | Attending: Cardiovascular Disease | Admitting: Cardiovascular Disease

## 2018-01-14 DIAGNOSIS — R079 Chest pain, unspecified: Secondary | ICD-10-CM

## 2018-01-15 ENCOUNTER — Telehealth: Payer: Self-pay | Admitting: Cardiovascular Disease

## 2018-01-15 NOTE — Telephone Encounter (Signed)
Called patient. He said he understood results of the CT score.  He remembered Dr Rockey Situ mentioned he may not need to continue taking his Lipitor.  He wanted to know if Dr Rockey Situ felt he could discontinue his atorvastatin and aspirin since his calcium score is 0.  We discussed his most recent lipid panel and how some numbers were elevated. Advised I will route to Dr Rockey Situ for his advice.

## 2018-01-15 NOTE — Telephone Encounter (Signed)
Pt calling stating he just missed a call from our office  He states it may be about his recent CT results  Please call back

## 2018-01-18 NOTE — Telephone Encounter (Signed)
I would leave it up to him There is no calcified coronary plaque noted, More rarely he can have NONcalcified plaque that can not be seen So although it is VERY encouraging his score is zero, this does not mean there is no plaque there,  Just no calcified plaque. So decision is his to [facility] right or wrong answer.  Just depends on how aggressive he wants to be with his numbers

## 2018-01-19 NOTE — Telephone Encounter (Signed)
Patient verbalized understanding of Dr Donivan Scull recommendations. He said he will stay on the Lipitor for now and see what his numbers are the next time. He was very Patent attorney.

## 2018-02-04 ENCOUNTER — Other Ambulatory Visit: Payer: Self-pay

## 2018-02-04 ENCOUNTER — Other Ambulatory Visit: Payer: Self-pay | Admitting: Family Medicine

## 2018-02-04 DIAGNOSIS — E034 Atrophy of thyroid (acquired): Secondary | ICD-10-CM | POA: Diagnosis not present

## 2018-02-04 DIAGNOSIS — E782 Mixed hyperlipidemia: Secondary | ICD-10-CM | POA: Diagnosis not present

## 2018-02-04 LAB — LIPID PANEL
CHOL/HDL RATIO: 2.7 (calc) (ref <5.0)
CHOLESTEROL: 120 mg/dL (ref <200)
HDL: 45 mg/dL (ref >40)
LDL CHOLESTEROL (CALC): 58 mg/dL
Non-HDL Cholesterol (Calc): 75 mg/dL (calc) (ref <130)
Triglycerides: 87 mg/dL (ref <150)

## 2018-02-04 LAB — TSH: TSH: 1.51 m[IU]/L (ref 0.40–4.50)

## 2018-02-04 MED ORDER — ATORVASTATIN CALCIUM 20 MG PO TABS: 20.0000 mg | ORAL_TABLET | Freq: Every day | ORAL | 3 refills | Status: DC

## 2018-02-04 NOTE — Progress Notes (Signed)
Refill statin

## 2018-02-05 ENCOUNTER — Other Ambulatory Visit: Payer: Self-pay | Admitting: Family Medicine

## 2018-02-05 MED ORDER — LEVOTHYROXINE SODIUM 125 MCG PO TABS: 125.0000 ug | ORAL_TABLET | Freq: Every day | ORAL | 3 refills | Status: DC

## 2018-02-05 NOTE — Progress Notes (Signed)
One year of refills sent

## 2018-03-17 DIAGNOSIS — D1801 Hemangioma of skin and subcutaneous tissue: Secondary | ICD-10-CM | POA: Diagnosis not present

## 2018-03-17 DIAGNOSIS — L814 Other melanin hyperpigmentation: Secondary | ICD-10-CM | POA: Diagnosis not present

## 2018-04-26 DIAGNOSIS — H4312 Vitreous hemorrhage, left eye: Secondary | ICD-10-CM | POA: Diagnosis not present

## 2018-05-03 DIAGNOSIS — H4312 Vitreous hemorrhage, left eye: Secondary | ICD-10-CM | POA: Diagnosis not present

## 2018-05-17 ENCOUNTER — Other Ambulatory Visit: Payer: Self-pay

## 2018-05-17 DIAGNOSIS — R972 Elevated prostate specific antigen [PSA]: Secondary | ICD-10-CM | POA: Diagnosis not present

## 2018-05-17 LAB — PSA: PSA: 0.7 ng/mL (ref <=4.0)

## 2018-05-20 DIAGNOSIS — H4312 Vitreous hemorrhage, left eye: Secondary | ICD-10-CM | POA: Diagnosis not present

## 2018-06-02 DIAGNOSIS — H4312 Vitreous hemorrhage, left eye: Secondary | ICD-10-CM | POA: Diagnosis not present

## 2018-06-07 DIAGNOSIS — H33312 Horseshoe tear of retina without detachment, left eye: Secondary | ICD-10-CM | POA: Diagnosis not present

## 2018-08-02 DIAGNOSIS — H43813 Vitreous degeneration, bilateral: Secondary | ICD-10-CM | POA: Diagnosis not present

## 2018-08-03 DIAGNOSIS — H33101 Unspecified retinoschisis, right eye: Secondary | ICD-10-CM | POA: Diagnosis not present

## 2018-08-24 ENCOUNTER — Ambulatory Visit (INDEPENDENT_AMBULATORY_CARE_PROVIDER_SITE_OTHER): Payer: Medicare Other

## 2018-08-24 DIAGNOSIS — Z23 Encounter for immunization: Secondary | ICD-10-CM

## 2018-08-26 IMAGING — CT CT HEART SCORING
2 series · 16 of 20 positions shown, 18 images · non-contrast
Comparison: None

EXAM:
OVER-READ INTERPRETATION  CT CHEST

The following report is an over-read performed by radiologist Dr.
does not include interpretation of cardiac or coronary anatomy or
pathology. The coronary calcium score interpretation by the
cardiologist is attached.
CLINICAL DATA: Risk stratification
Coronary Calcium Score
MEDICATIONS:
None.
TECHNIQUE: The patient was scanned on a Siemens Force scanner. Axial
non-contrast 3 mm slices were carried out through the heart. The
data set was analyzed on a dedicated work station and scored using
the Agatson method.

[Series 2: casc 3.0 i36f 2 bestdiast 66 % · axial · 0.37mm/px · z∈[-254,-149]mm · 8 of 46 slices shown, 10 images]
[im 6/46  vessel]
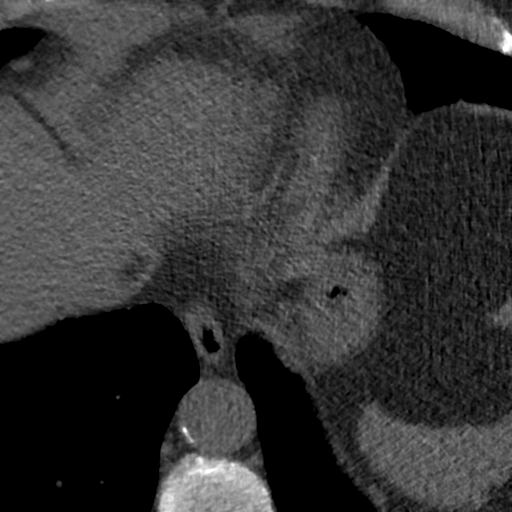
[im 6/46  lung]
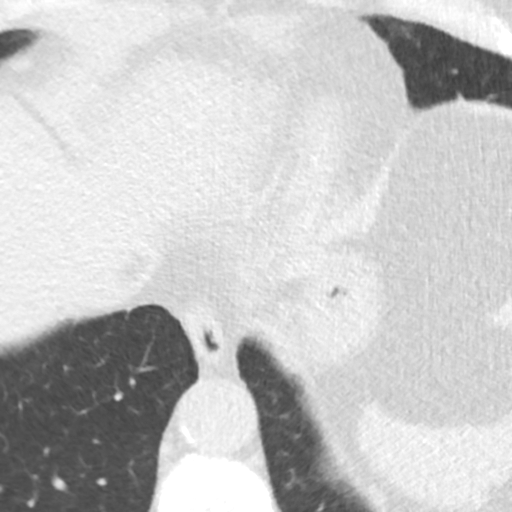
[im 11/46  vessel]
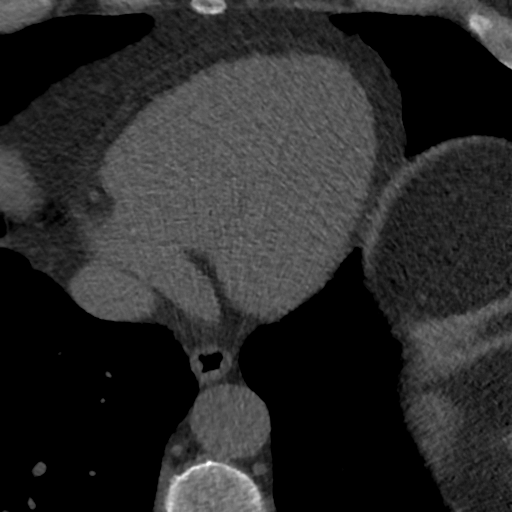
[im 16/46  vessel]
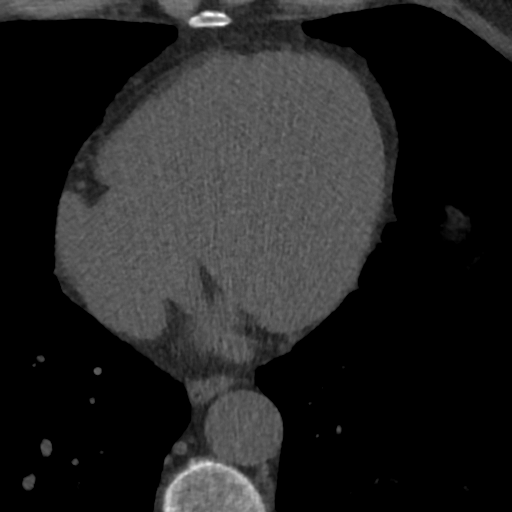
[im 21/46  vessel]
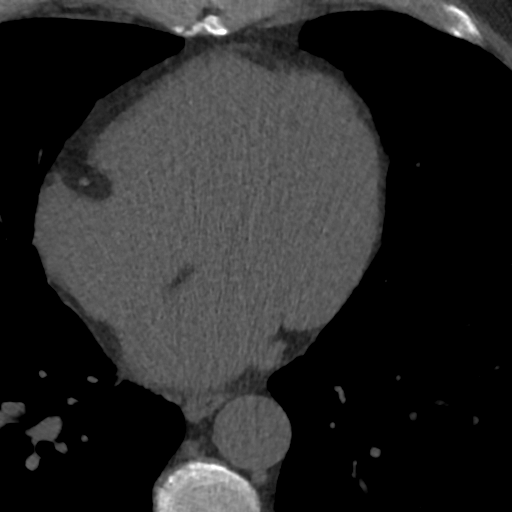
[im 26/46  vessel]
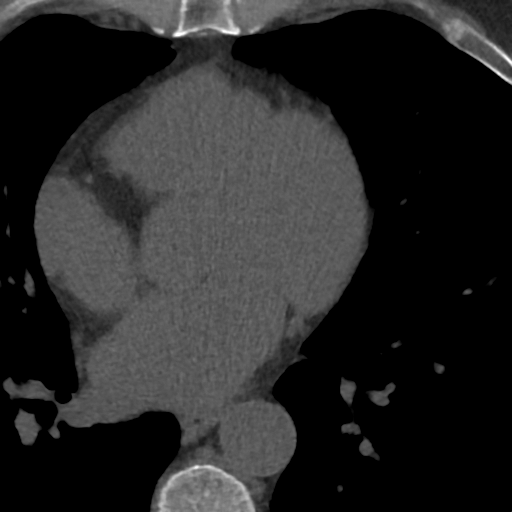
[im 26/46  lung]
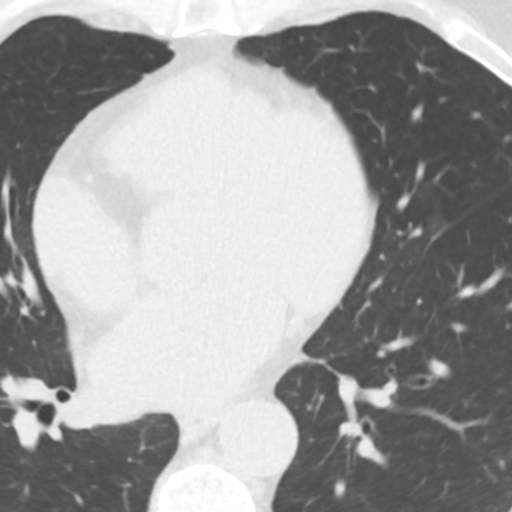
[im 31/46  vessel]
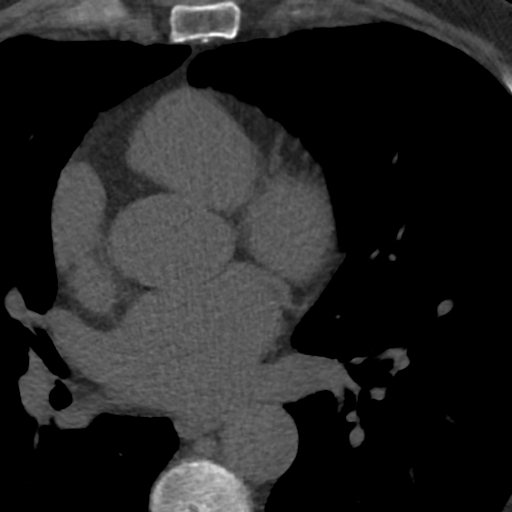
[im 36/46  vessel]
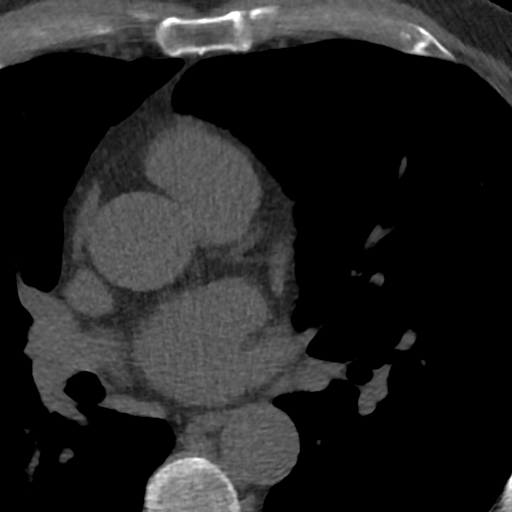
[im 41/46  vessel]
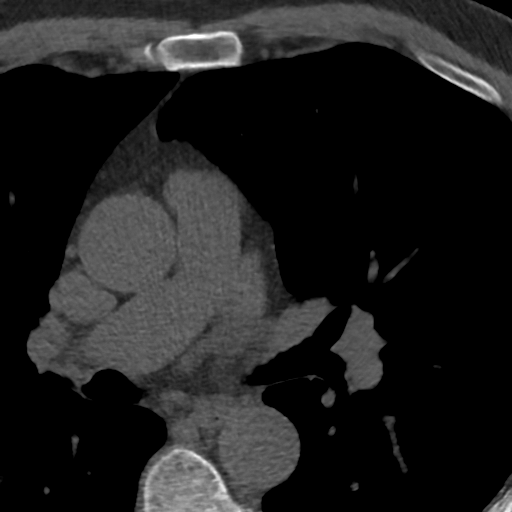

[Series 4: lung st 66 % · axial · 0.73mm/px · z∈[-254,-146]mm · 8 of 48 slices shown]
[im 6/48  lung]
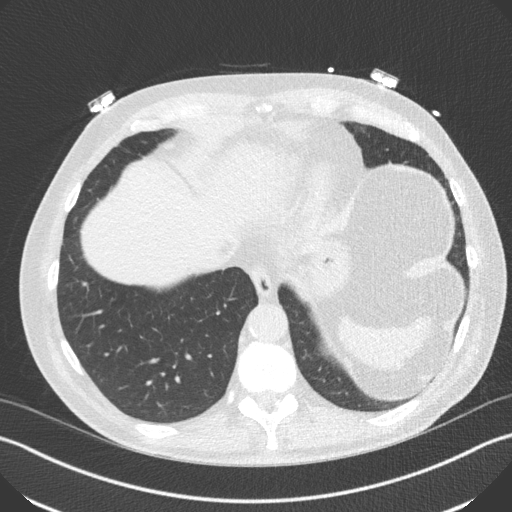
[im 11/48  lung]
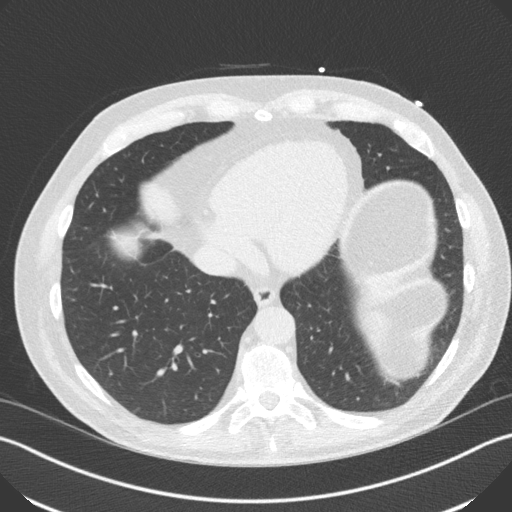
[im 16/48  lung]
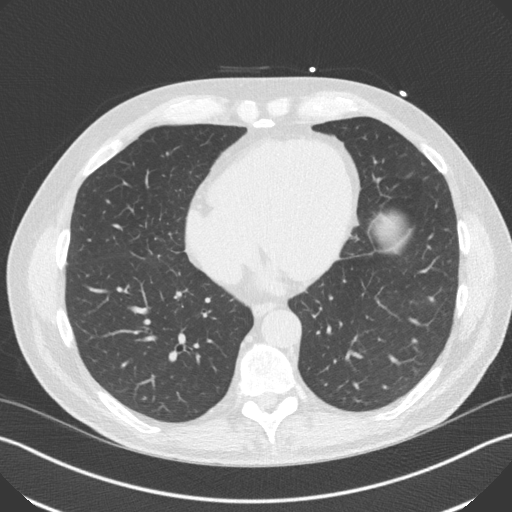
[im 21/48  lung]
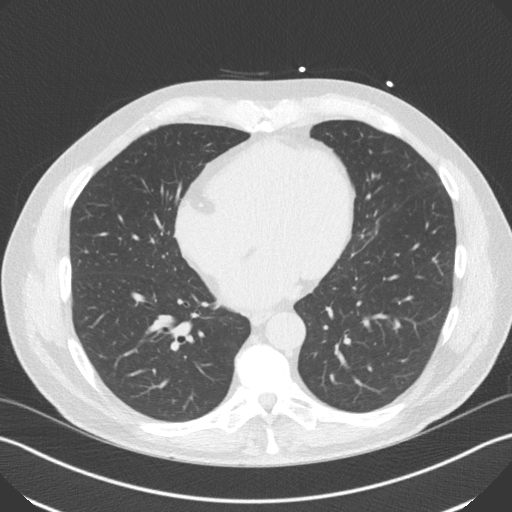
[im 27/48  lung]
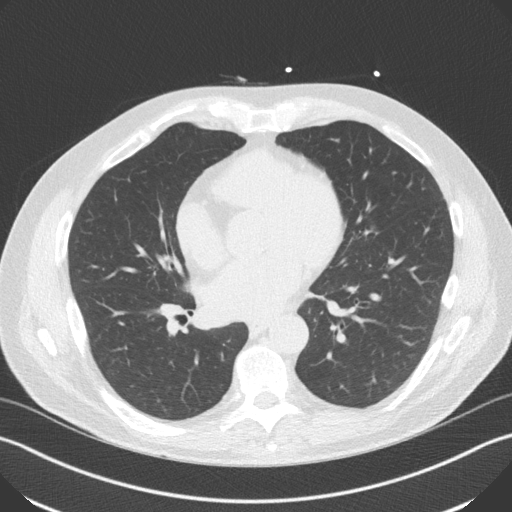
[im 32/48  lung]
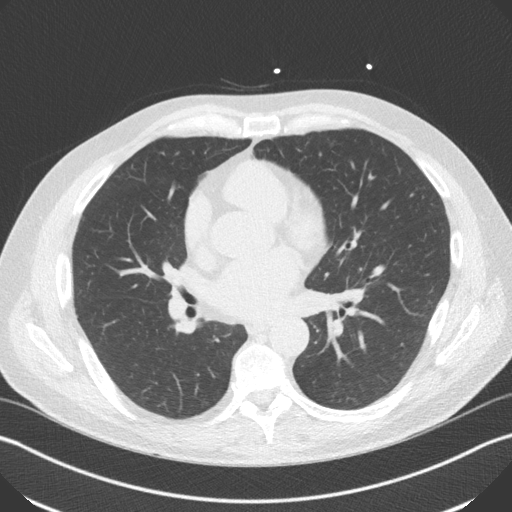
[im 37/48  lung]
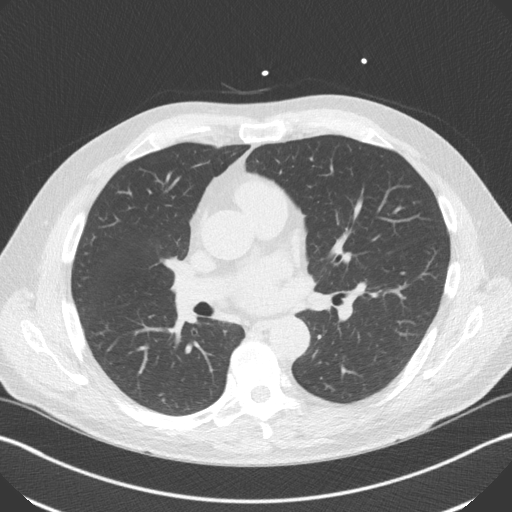
[im 42/48  lung]
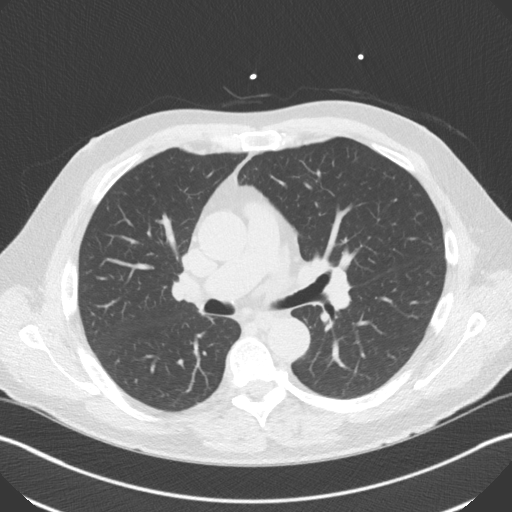

[16 of 20 positions shown; findings below may reference images not displayed]

FINDINGS: No mediastinal or hilar adenopathy.

No pleural effusion. Mild changes of centrilobular emphysema. There
is mild diffuse bronchial wall thickening. No airspace
consolidation, atelectasis or pneumothorax visualized. No pulmonary
nodule or mass.

No acute findings within the upper abdomen.

No suspicious bone abnormality.
IMPRESSION: 1. Negative over-read.
FINDINGS: Non-cardiac: See separate report from [REDACTED].

Ascending Aorta: Normal size, trivial calcifications in the aortic
root.

Pericardium: Normal.

Coronary arteries: Normal origin.
IMPRESSION: Coronary calcium score of 0. This was 0 percentile for age and sex
matched control.

## 2018-09-06 DIAGNOSIS — Z08 Encounter for follow-up examination after completed treatment for malignant neoplasm: Secondary | ICD-10-CM | POA: Diagnosis not present

## 2018-09-06 DIAGNOSIS — Z85828 Personal history of other malignant neoplasm of skin: Secondary | ICD-10-CM | POA: Diagnosis not present

## 2018-09-06 DIAGNOSIS — D2271 Melanocytic nevi of right lower limb, including hip: Secondary | ICD-10-CM | POA: Diagnosis not present

## 2018-09-06 DIAGNOSIS — D225 Melanocytic nevi of trunk: Secondary | ICD-10-CM | POA: Diagnosis not present

## 2018-09-06 DIAGNOSIS — D2261 Melanocytic nevi of right upper limb, including shoulder: Secondary | ICD-10-CM | POA: Diagnosis not present

## 2018-09-06 DIAGNOSIS — D2262 Melanocytic nevi of left upper limb, including shoulder: Secondary | ICD-10-CM | POA: Diagnosis not present

## 2018-09-06 DIAGNOSIS — L821 Other seborrheic keratosis: Secondary | ICD-10-CM | POA: Diagnosis not present

## 2018-09-06 DIAGNOSIS — L570 Actinic keratosis: Secondary | ICD-10-CM | POA: Diagnosis not present

## 2018-09-06 DIAGNOSIS — D2272 Melanocytic nevi of left lower limb, including hip: Secondary | ICD-10-CM | POA: Diagnosis not present

## 2018-09-10 ENCOUNTER — Ambulatory Visit: Payer: Medicare Other

## 2018-10-06 ENCOUNTER — Other Ambulatory Visit: Payer: Self-pay

## 2018-10-06 ENCOUNTER — Emergency Department
Admission: EM | Admit: 2018-10-06 | Discharge: 2018-10-06 | Disposition: A | Discharge status: Home / Self Care | Payer: Medicare Other | Attending: Emergency Medicine | Admitting: Emergency Medicine

## 2018-10-06 DIAGNOSIS — Y998 Other external cause status: Secondary | ICD-10-CM | POA: Diagnosis not present

## 2018-10-06 DIAGNOSIS — S61012A Laceration without foreign body of left thumb without damage to nail, initial encounter: Secondary | ICD-10-CM | POA: Insufficient documentation

## 2018-10-06 DIAGNOSIS — Z87891 Personal history of nicotine dependence: Secondary | ICD-10-CM | POA: Insufficient documentation

## 2018-10-06 DIAGNOSIS — Z79899 Other long term (current) drug therapy: Secondary | ICD-10-CM | POA: Diagnosis not present

## 2018-10-06 DIAGNOSIS — E039 Hypothyroidism, unspecified: Secondary | ICD-10-CM | POA: Insufficient documentation

## 2018-10-06 DIAGNOSIS — Z85828 Personal history of other malignant neoplasm of skin: Secondary | ICD-10-CM | POA: Diagnosis not present

## 2018-10-06 DIAGNOSIS — Y93G1 Activity, food preparation and clean up: Secondary | ICD-10-CM | POA: Insufficient documentation

## 2018-10-06 DIAGNOSIS — W291XXA Contact with electric knife, initial encounter: Secondary | ICD-10-CM | POA: Diagnosis not present

## 2018-10-06 DIAGNOSIS — Z7982 Long term (current) use of aspirin: Secondary | ICD-10-CM | POA: Diagnosis not present

## 2018-10-06 DIAGNOSIS — Y92009 Unspecified place in unspecified non-institutional (private) residence as the place of occurrence of the external cause: Secondary | ICD-10-CM | POA: Insufficient documentation

## 2018-10-06 NOTE — ED Notes (Signed)
Pt with small lac to left thumb. PA to dermabond the finger.

## 2018-10-06 NOTE — ED Provider Notes (Signed)
[facility] Emergency Department Provider Note  ____________________________________________  Time seen: Approximately 7:06 PM  I have reviewed the triage vital signs and the nursing notes.   HISTORY  Chief Complaint Laceration    HPI Marc Jackson is a 72 y.o. male who presents the emergency department complaining of laceration to the left thumb.  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  Bleeding was easily controlled with direct pressure.  Patient denies any loss of range of motion.  Patient's last tetanus shot was as a teenager, however patient had a significant allergic reaction to the tetanus vaccine.  No other injury or complaint.  Patient is not on any blood thinners.    Past Medical History:  Diagnosis Date  . Cancer ([place])    SKIN  . Cataract   . Hypothyroidism   . IBS (irritable bowel syndrome)   . Thyroid disease     Patient Active Problem List   Diagnosis Date Noted  . Hyperglycemia 11/10/2017  . Alcohol use 11/23/2016  . Hyperlipidemia 11/11/2016  . Screening for prostate cancer 11/11/2016  . Family hx of prostate cancer 11/11/2016  . Hypothyroidism due to acquired atrophy of thyroid 11/09/2015  . History of skin cancer 11/09/2015  . Annual physical exam 11/09/2015  . Allergic rhinitis with postnasal drip 11/09/2015  . Screening for STD (sexually transmitted disease) 11/09/2015  . Encounter for cholesteral screening for cardiovascular disease 11/09/2015  . Encounter for screening for malignant neoplasm of prostate 11/09/2015    Past Surgical History:  Procedure Laterality Date  . BACK SURGERY    . CATARACT EXTRACTION W/PHACO Right 11/24/2017   Procedure: CATARACT EXTRACTION PHACO AND INTRAOCULAR LENS PLACEMENT (IOC);  Surgeon: Birder Robson, MD;  Location: [facility] ORS;  Service: Ophthalmology;  Laterality: Right;  [place] 00:28.3 AP% 14.6 CDE 4.12 Fluid pack Lot # [id number]   . CATARACT EXTRACTION W/PHACO Left 12/29/2017   Procedure: CATARACT EXTRACTION PHACO AND INTRAOCULAR LENS PLACEMENT (IOC);  Surgeon: Birder Robson, MD;  Location: [facility] ORS;  Service: Ophthalmology;  Laterality: Left;  [place] 00:30.4 AP% 14.0 CDE 4.25 Fluid Pack Lot # [id number]  . CO2 LASER OF [place]    . discetomy    . HERNIA REPAIR     X 2  . SKIN CANCER DESTRUCTION      Prior to Admission medications   Medication Sig Start Date End Date Taking? Authorizing Provider  aspirin EC 81 MG tablet Take 81 mg by mouth 4 (four) times a week.    [provider]  atorvastatin (LIPITOR) 20 MG tablet Take 1 tablet (20 mg total) by mouth at bedtime. 02/04/18   Lada, Satira Anis, MD  levothyroxine (SYNTHROID, LEVOTHROID) 125 MCG tablet Take 1 tablet (125 mcg total) by mouth daily. 02/05/18   Arnetha Courser, MD    Allergies Tetanus toxoids and Penicillins  Family History  Problem Relation Age of Onset  . Cancer Father        prostate  . Heart disease Father   . Hypothyroidism Sister   . Heart disease Mother        CHF, atrial fibrillation  . Hyperlipidemia Mother        high TG  . Diabetes Brother   . Heart disease Brother   . Obesity Brother     Social History Social History   Tobacco Use  . Smoking status: Former Research scientist (life sciences)  . Smokeless tobacco: Never Used  . Tobacco  comment: Quit 1985  Substance Use Topics  . Alcohol use: Yes    Alcohol/week: 28.0 standard drinks    Types: 28 Glasses of wine per week  . Drug use: No     Review of Systems  Constitutional: No fever/chills Eyes: No visual changes.  Cardiovascular: no chest pain. Respiratory: no cough. No SOB. Gastrointestinal: No abdominal pain.  No nausea, no vomiting.   Musculoskeletal: Negative for musculoskeletal pain. Skin: Positive for laceration to the left thumb Neurological: Negative for headaches, focal weakness or numbness. 10-point ROS otherwise  negative.  ____________________________________________   PHYSICAL EXAM:  VITAL SIGNS: ED Triage Vitals  Enc Vitals Group     BP 10/06/18 1802 (!) 142/78     Pulse Rate 10/06/18 1802 78     Resp 10/06/18 1802 19     Temp 10/06/18 1802 98 F (36.7 C)     Temp Source 10/06/18 1802 Oral     SpO2 10/06/18 1802 95 %     Weight 10/06/18 1801 200 lb (90.7 kg)     Height 10/06/18 1801 6\' 1"  (1.854 m)     Head Circumference --      Peak Flow --      Pain Score 10/06/18 1801 2     Pain Loc --      Pain Edu? --      Excl. in [place]? --      Constitutional: Alert and oriented. Well appearing and in no acute distress. Eyes: Conjunctivae are normal. PERRL. EOMI. Head: Atraumatic. Neck: No stridor.   Cardiovascular: Normal rate, regular rhythm. Normal S1 and S2.  Good peripheral circulation. Respiratory: Normal respiratory effort without tachypnea or retractions. Lungs CTAB. Good air entry to the bases with no decreased or absent breath sounds. Musculoskeletal: Full range of motion to all extremities. No gross deformities appreciated. Neurologic:  Normal speech and language. No gross focal neurologic deficits are appreciated.  Skin:  Skin is warm, dry and intact. No rash noted.  Visualization of the left thumb reveals a 1 cm linear laceration extending from the most distal aspect of the thumb into the finger pad.  No active bleeding.  Edges are smooth in nature.  Laceration is not deep.  No involvement of the nail.  No foreign body.  Full range of motion to the digit.  Sensation and cap refill intact to the digit. Psychiatric: Mood and affect are normal. Speech and behavior are normal. Patient exhibits appropriate insight and judgement.   ____________________________________________   LABS (all labs ordered are listed, but only abnormal results are displayed)  Labs Reviewed - No data to  display ____________________________________________  EKG   ____________________________________________  RADIOLOGY   No results found.  ____________________________________________    PROCEDURES  Procedure(s) performed:    Marland KitchenMarland KitchenLaceration Repair Date/Time: 10/06/2018 7:48 PM Performed by: Darletta Moll, PA-C Authorized by: Darletta Moll, PA-C   Consent:    Consent obtained:  Verbal   Consent given by:  Patient   Risks discussed:  Pain Anesthesia (see MAR for exact dosages):    Anesthesia method:  None Laceration details:    Location:  Finger   Finger location:  L thumb   Length (cm):  1 Repair type:    Repair type:  Simple Exploration:    Hemostasis achieved with:  Direct pressure   Wound exploration: wound explored through full range of motion and entire depth of wound probed and visualized     Wound extent: no foreign bodies/material noted, no muscle  damage noted, no nerve damage noted, no tendon damage noted, no underlying fracture noted and no vascular damage noted     Contaminated: no   Treatment:    Area cleansed with:  Betadine   Amount of cleaning:  Extensive   Irrigation solution:  Sterile saline Skin repair:    Repair method:  Tissue adhesive Approximation:    Approximation:  Close Post-procedure details:    Dressing:  Open (no dressing)   Patient tolerance of procedure:  Tolerated well, no immediate complications      Medications - No data to display   ____________________________________________   INITIAL IMPRESSION / ASSESSMENT AND PLAN / ED COURSE  Pertinent labs & imaging results that were available during my care of the patient were reviewed by me and considered in my medical decision making (see chart for details).  Review of the [place] CSRS was performed in accordance of the [place] prior to dispensing any controlled drugs.      Patient's diagnosis is consistent with laceration to the left thumb.  Patient presents  emergency department after accidentally cutting his left thumb while carving a [place].  Overall, exam is reassuring with a relatively superficial laceration.  Given nature of injury, area was thoroughly cleansed, Dermabond applied with good success.  Patient has had a serious allergic reaction to the tetanus vaccine and as such it is not updated.  Follow-up with primary care as needed.. Patient is given ED precautions to return to the ED for any worsening or new symptoms.     ____________________________________________  FINAL CLINICAL IMPRESSION(S) / ED DIAGNOSES  Final diagnoses:  Laceration of left thumb without foreign body without damage to nail, initial encounter      NEW MEDICATIONS STARTED DURING THIS VISIT:  ED Discharge Orders    None          This chart was dictated using voice recognition software/Dragon. Despite best efforts to proofread, errors can occur which can change the meaning. Any change was purely unintentional.    Brynda Peon 10/06/18 2011    Carrie Mew, MD 10/06/18 2350

## 2018-10-06 NOTE — ED Triage Notes (Signed)
Pt comes via POV from home with c/o laceration noted to left thumb. Pt states he was slicing the [place] and cut his finger.  Pt has bandage in place and is controlling bleeding at this time.  Respirations even and unlabored.

## 2018-10-15 DIAGNOSIS — L821 Other seborrheic keratosis: Secondary | ICD-10-CM | POA: Diagnosis not present

## 2018-10-15 DIAGNOSIS — Z711 Person with feared health complaint in whom no diagnosis is made: Secondary | ICD-10-CM | POA: Diagnosis not present

## 2018-11-12 ENCOUNTER — Ambulatory Visit (INDEPENDENT_AMBULATORY_CARE_PROVIDER_SITE_OTHER): Payer: Medicare Other | Admitting: Family Medicine

## 2018-11-12 ENCOUNTER — Encounter: Payer: Self-pay | Admitting: Family Medicine

## 2018-11-12 ENCOUNTER — Ambulatory Visit (INDEPENDENT_AMBULATORY_CARE_PROVIDER_SITE_OTHER): Payer: Medicare Other

## 2018-11-12 VITALS — BP 112/78 | HR 59 | Temp 98.1°F | Ht 72.5 in | Wt 203.2 lb

## 2018-11-12 VITALS — BP 112/78 | HR 59 | Temp 98.1°F | Resp 16 | Ht 72.5 in | Wt 203.2 lb

## 2018-11-12 DIAGNOSIS — Z789 Other specified health status: Secondary | ICD-10-CM

## 2018-11-12 DIAGNOSIS — Z7289 Other problems related to lifestyle: Secondary | ICD-10-CM | POA: Diagnosis not present

## 2018-11-12 DIAGNOSIS — R739 Hyperglycemia, unspecified: Secondary | ICD-10-CM

## 2018-11-12 DIAGNOSIS — Z0000 Encounter for general adult medical examination without abnormal findings: Secondary | ICD-10-CM

## 2018-11-12 DIAGNOSIS — J309 Allergic rhinitis, unspecified: Secondary | ICD-10-CM

## 2018-11-12 DIAGNOSIS — R0982 Postnasal drip: Secondary | ICD-10-CM

## 2018-11-12 DIAGNOSIS — E034 Atrophy of thyroid (acquired): Secondary | ICD-10-CM | POA: Diagnosis not present

## 2018-11-12 DIAGNOSIS — R635 Abnormal weight gain: Secondary | ICD-10-CM

## 2018-11-12 DIAGNOSIS — Z5181 Encounter for therapeutic drug level monitoring: Secondary | ICD-10-CM | POA: Diagnosis not present

## 2018-11-12 DIAGNOSIS — E782 Mixed hyperlipidemia: Secondary | ICD-10-CM

## 2018-11-12 NOTE — Progress Notes (Signed)
Subjective:   Marc Jackson is a 73 y.o. male who presents for Medicare Annual/Subsequent preventive examination.  Review of Systems:   Cardiac Risk Factors include: advanced age (>38men, >62 women);male gender     Objective:    Vitals: BP 112/78 (BP Location: Left Arm, Patient Position: Sitting, Cuff Size: Normal)   Pulse (!) 59   Temp 98.1 F (36.7 C) (Oral)   Resp 16   Ht 6' 0.5" (1.842 m)   Wt 203 lb 3.2 oz (92.2 kg)   BMI 27.18 kg/m   Body mass index is 27.18 kg/m.  Advanced Directives 11/12/2018 10/06/2018 11/24/2017 11/11/2016 11/09/2015 05/03/2015  Does Patient Have a Medical Advance Directive? Yes No Yes Yes Yes Yes  Type of Paramedic of [place];Living will - [place];Living will [place];Living will Living will [place];Living will  Copy of [place] in Chart? No - copy requested - No - copy requested - No - copy requested -    Tobacco Social History   Tobacco Use  Smoking Status Former Smoker  Smokeless Tobacco Never Used  Tobacco Comment   Quit 1985     Counseling given: Not Answered Comment: Quit 1985   Clinical Intake:  Pre-visit preparation completed: Yes  Pain : No/denies pain     Nutritional Status: BMI 25 -29 Overweight Nutritional Risks: (periodic diarrhea from IBS) Diabetes: No  How often do you need to have someone help you when you read instructions, pamphlets, or other written materials from your doctor or pharmacy?: 1 - Never  Interpreter Needed?: No  Information entered by :: Marc Marker LPN  Past Medical History:  Diagnosis Date  . Cancer ([place])    SKIN  . Cataract   . Hypothyroidism   . IBS (irritable bowel syndrome)   . Thyroid disease    Past Surgical History:  Procedure Laterality Date  . BACK SURGERY    . CATARACT EXTRACTION W/PHACO Right 11/24/2017   Procedure: CATARACT EXTRACTION PHACO AND INTRAOCULAR LENS  PLACEMENT (IOC);  Surgeon: Birder Robson, MD;  Location: [facility] ORS;  Service: Ophthalmology;  Laterality: Right;  [place] 00:28.3 AP% 14.6 CDE 4.12 Fluid pack Lot # [id number]  . CATARACT EXTRACTION W/PHACO Left 12/29/2017   Procedure: CATARACT EXTRACTION PHACO AND INTRAOCULAR LENS PLACEMENT (IOC);  Surgeon: Birder Robson, MD;  Location: [facility] ORS;  Service: Ophthalmology;  Laterality: Left;  [place] 00:30.4 AP% 14.0 CDE 4.25 Fluid Pack Lot # [id number]  . CO2 LASER OF [place]    . discetomy    . HERNIA REPAIR     X 2  . SKIN CANCER DESTRUCTION     Family History  Problem Relation Age of Onset  . Cancer Father        prostate  . Heart disease Father   . Hypothyroidism Sister   . Heart disease Mother        CHF, atrial fibrillation  . Hyperlipidemia Mother        high TG  . Diabetes Brother   . Heart disease Brother   . Obesity Brother    Social History   Socioeconomic History  . Marital status: Married    Spouse name: Not on file  . Number of children: 0  . Years of education: Not on file  . Highest education level: Master's degree (e.g., MA, MS, MEng, MEd, MSW, MBA)  Occupational History  . Not on file  Social Needs  . Financial resource strain:  Not hard at all  . Food insecurity:    Worry: Never true    Inability: Never true  . Transportation needs:    Medical: No    Non-medical: No  Tobacco Use  . Smoking status: Former Research scientist (life sciences)  . Smokeless tobacco: Never Used  . Tobacco comment: Quit 1985  Substance and Sexual Activity  . Alcohol use: Yes    Alcohol/week: 28.0 standard drinks    Types: 28 Glasses of wine per week  . Drug use: No  . Sexual activity: Yes    Partners: Female  Lifestyle  . Physical activity:    Days per week: 0 days    Minutes per session: 0 min  . Stress: To some extent  Relationships  . Social connections:    Talks on phone: More than three times a week    Gets together: More than three times a week    Attends religious service: More  than 4 times per year    Active member of club or organization: No    Attends meetings of clubs or organizations: Never    Relationship status: Married  Other Topics Concern  . 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 aspirin EC 81 MG tablet Take 81 mg by mouth 4 (four) times a week.  Marland Kitchen atorvastatin (LIPITOR) 20 MG tablet Take 1 tablet (20 mg total) by mouth at bedtime.  Marland Kitchen levothyroxine (SYNTHROID, LEVOTHROID) 125 MCG tablet Take 1 tablet (125 mcg total) by mouth daily.   No facility-administered encounter medications on file as of 11/12/2018.     Activities of Daily Living In your present state of health, do you have any difficulty performing the following activities: 11/12/2018 11/12/2018  Hearing? N N  Comment - declines hearing aids  Vision? N N  Comment - wears reading glasses  Difficulty concentrating or making decisions? N N  Walking or climbing stairs? N N  Dressing or bathing? N N  Doing errands, shopping? N N  Preparing Food and eating ? - N  Using the Toilet? - N  In the past six months, have you accidently leaked urine? - N  Do you have problems with loss of bowel control? - N  Managing your Medications? - N  Managing your Finances? - N  Housekeeping or managing your Housekeeping? - N  Some recent data might be hidden    Patient Care Team: Lada, Satira Anis, MD as PCP - General (Family Medicine) Dasher, Rayvon Char, MD (Dermatology)   Assessment:   This is a routine wellness examination for Marc Jackson.  Exercise Activities and Dietary recommendations Current Exercise Habits: The patient does not participate in regular exercise at present(not exercising during campaign but avid cyclist, and typically does cardio 3-4 times per week), Exercise limited by: None identified  Goals    . Weight (lb) <  189 lb 6.4 oz (85.9 kg)       Fall Risk Fall Risk  11/12/2018 11/10/2017 11/11/2016 11/09/2015 05/03/2015  Falls in the past year? 0 No No No No  Number falls in past yr: 0 - - - -  Injury with Fall? 0 - - - -  Follow up Falls prevention discussed - - - -   FALL RISK PREVENTION PERTAINING TO THE HOME:  Any stairs in or around the home  WITH handrails? Yes  Home free of loose throw rugs in walkways, pet beds, electrical cords, etc? Yes  Adequate lighting in your home to reduce risk of falls? Yes   ASSISTIVE DEVICES UTILIZED TO PREVENT FALLS:  Life alert? No  Use of a cane, walker or w/c? No  Grab bars in the bathroom? Yes  Shower chair or bench in shower? No  Elevated toilet seat or a handicapped toilet? No   DME ORDERS:  DME order needed?  No   TIMED UP AND GO:  Was the test performed? Yes .  Length of time to ambulate 10 feet: 5 sec.   GAIT:  Appearance of gait: Gait stead-fast and without the use of an assistive device.Education: Fall risk prevention has been discussed.  Intervention(s) required? No   Depression Screen PHQ 2/9 Scores 11/12/2018 11/10/2017 11/11/2016 11/09/2015  PHQ - 2 Score 0 0 0 0    Cognitive Function     6CIT Screen 11/12/2018 11/10/2017  What Year? 0 points 0 points  What month? 0 points 0 points  What time? 0 points 0 points  Count back from 20 0 points 0 points  Months in reverse 0 points 0 points  Repeat phrase 0 points 0 points  Total Score 0 0    Immunization History  Administered Date(s) Administered  . Influenza, High Dose Seasonal PF 08/24/2018  . Influenza-Unspecified 08/01/2015, 07/30/2016, 07/30/2017  . Pneumococcal Conjugate-13 09/14/2014  . Pneumococcal-Unspecified 08/08/2013  . Zoster 07/13/2010  . Zoster Recombinat (Shingrix) 05/16/2018, 09/04/2018    Qualifies for Shingles Vaccine? Yes  Shingrix series completed 2019.  Tdap: Although this vaccine is not a covered service during a Wellness Exam, does the patient still  wish to receive this vaccine today?  No . Pt allergic to tetanus toxoid.   Flu Vaccine: Up to date  Pneumococcal Vaccine: Up to date   Screening Tests Health Maintenance  Topic Date Due  . COLONOSCOPY  10/14/2019  . INFLUENZA VACCINE  Completed  . Hepatitis C Screening  Completed  . PNA vac Low Risk Adult  Completed  . TETANUS/TDAP  Discontinued   Cancer Screenings:  Colorectal Screening: Completed 2011. Repeat every 10 years;    Lung Cancer Screening: (Low Dose CT Chest recommended if Age 9-80 years, 30 pack-year currently smoking OR have quit w/in 15years.) does not qualify.    Additional Screening:  Hepatitis C Screening: does qualify; Completed 11/09/15  Vision Screening: Recommended annual ophthalmology exams for early detection of glaucoma and other disorders of the eye. Is the patient up to date with their annual eye exam?  Yes  Who is the provider or what is the name of the office in which the pt attends annual eye exams? Dr. Michelene Heady [facility]   Dental Screening: Recommended annual dental exams for proper oral hygiene  Community Resource Referral:  CRR required this visit?  No       Plan:    I have personally reviewed and addressed the Medicare Annual Wellness questionnaire and have noted the following in the patient's chart:  A. Medical and social history B. Use of alcohol, tobacco or illicit drugs  C. Current medications and supplements D. Functional ability and status E.  Nutritional status F.  Physical activity G. Advance directives H. List of other physicians I.  Hospitalizations, surgeries, and ER visits in previous 12 months J.  Corder such as hearing and vision if needed, cognitive and depression L. Referrals and appointments   In addition,  I have reviewed and discussed with patient certain preventive protocols, quality metrics, and best practice recommendations. A written personalized care plan for preventive services  as well as general preventive health recommendations were provided to patient.   Signed,  Marc Marker, LPN Nurse Health Advisor   Nurse Notes: Pt doing well and appreciative of visit today. He is currently running for another term for [employer]. He and his wife are avid cyclists and active in the community.

## 2018-11-12 NOTE — Progress Notes (Signed)
BP 112/78   Pulse (!) 59   Temp 98.1 F (36.7 C)   Ht 6' 0.5" (1.842 m)   Wt 203 lb 3.2 oz (92.2 kg)   SpO2 97%   BMI 27.18 kg/m    Subjective:    Patient ID: Marc Jackson, male    DOB: Apr 29, 1946, 73 y.o.   MRN: [id number]  HPI: Marc Jackson is a 73 y.o. male  Chief Complaint  Patient presents with  . Annual Exam    HPI  Here for follow-up  He has not been exercising as much; biked across [place] in October, 15th time, 478 miles in 7 days; busy lately so not as active; will think about fitting in some time daily  High cholesterol; reviewed previous labs; LDL in April 2019 was 58, HDL was 45  High glucose; last A1c in Jan 2019 was 5.6  Hypothyroidism; last TSH was 1.51 in April of 2019 Weight goes up this time of year Idea body weight he thinks is 189 pounds  CKD stage 2, baby aspirin 4x a week; no NSAIDs; good water drinker, flavored water; sips on coffee until noon, half decaf, afternoon is lots of water  Drinks wine through the evening; 4-5 drinks a night, wine  Allergies; doing well; not anything regularly; nasal rinses if he thinks an infection is going to start  Depression screen [facility] 2/9 11/12/2018 11/10/2017 11/11/2016 11/09/2015  Decreased Interest 0 0 0 0  Down, Depressed, Hopeless 0 0 0 0  PHQ - 2 Score 0 0 0 0   Fall Risk  11/12/2018 11/10/2017 11/11/2016 11/09/2015 05/03/2015  Falls in the past year? 0 No No No No  Number falls in past yr: 0 - - - -  Injury with Fall? 0 - - - -  Follow up Falls prevention discussed - - - -    Relevant past medical, surgical, family and social history reviewed Past Medical History:  Diagnosis Date  . Cancer ([place])    SKIN  . Cataract   . Hypothyroidism   . IBS (irritable bowel syndrome)   . Thyroid disease    Past Surgical History:  Procedure Laterality Date  . BACK SURGERY    . CATARACT EXTRACTION W/PHACO Right 11/24/2017   Procedure: CATARACT EXTRACTION PHACO AND INTRAOCULAR LENS PLACEMENT (IOC);   Surgeon: Birder Robson, MD;  Location: [facility] ORS;  Service: Ophthalmology;  Laterality: Right;  [place] 00:28.3 AP% 14.6 CDE 4.12 Fluid pack Lot # [id number]  . CATARACT EXTRACTION W/PHACO Left 12/29/2017   Procedure: CATARACT EXTRACTION PHACO AND INTRAOCULAR LENS PLACEMENT (IOC);  Surgeon: Birder Robson, MD;  Location: [facility] ORS;  Service: Ophthalmology;  Laterality: Left;  [place] 00:30.4 AP% 14.0 CDE 4.25 Fluid Pack Lot # [id number]  . CO2 LASER OF [place]    . discetomy    . HERNIA REPAIR     X 2  . SKIN CANCER DESTRUCTION     Family History  Problem Relation Age of Onset  . Cancer Father        prostate  . Heart disease Father   . Hypothyroidism Sister   . Heart disease Mother        CHF, atrial fibrillation  . Hyperlipidemia Mother        high TG  . Diabetes Brother   . Heart disease Brother   . Obesity Brother    Social History   Tobacco Use  . Smoking status: Former Research scientist (life sciences)  . Smokeless tobacco: Never Used  .  Tobacco comment: Quit 1985  Substance Use Topics  . Alcohol use: Yes    Alcohol/week: 28.0 standard drinks    Types: 28 Glasses of wine per week  . Drug use: No     [facility] Visit from 11/12/2018 in [facility]  AUDIT-C Score  5      Interim medical history since last visit reviewed. Allergies and medications reviewed  Review of Systems Per HPI unless specifically indicated above     Objective:    BP 112/78   Pulse (!) 59   Temp 98.1 F (36.7 C)   Ht 6' 0.5" (1.842 m)   Wt 203 lb 3.2 oz (92.2 kg)   SpO2 97%   BMI 27.18 kg/m   Wt Readings from Last 3 Encounters:  11/12/18 203 lb 3.2 oz (92.2 kg)  11/12/18 203 lb 3.2 oz (92.2 kg)  10/06/18 200 lb (90.7 kg)    Physical Exam Constitutional:      General: He is not in acute distress.    Appearance: He is well-developed.     Comments: Appears younger than stated age  HENT:     Head: Normocephalic and atraumatic.  Eyes:     General: No scleral icterus. Neck:     Thyroid:  No thyromegaly.  Cardiovascular:     Rate and Rhythm: Normal rate and regular rhythm.  Pulmonary:     Effort: Pulmonary effort is normal.     Breath sounds: Normal breath sounds.  Abdominal:     General: Bowel sounds are normal. There is no distension.     Palpations: Abdomen is soft.  Skin:    General: Skin is warm and dry.     Coloration: Skin is not pale.  Neurological:     Mental Status: He is alert.     Coordination: Coordination normal.  Psychiatric:        Behavior: Behavior normal.        Thought Content: Thought content normal.        Judgment: Judgment normal.     Results for orders placed or performed in visit on 05/17/18  PSA  Result Value Ref Range   PSA 0.7 < OR = 4.0 ng/mL      Assessment & Plan:   Problem List Items Addressed This Visit      Respiratory   Allergic rhinitis with postnasal drip    antihsitamine and saline rinses PRN; avoid decongestants        Endocrine   Hypothyroidism due to acquired atrophy of thyroid - Primary    Check TSH yearly        Other   Hyperlipidemia    Continue statin; continue aspirin; limit saturated fats      Relevant Orders   Lipid panel   Hyperglycemia    Check glucose and A1c      Relevant Orders   Hemoglobin A1c   Alcohol use    Encouraged decreasing alcohol, he declined assistance       Other Visit Diagnoses    Weight gain       Relevant Orders   TSH   Medication monitoring encounter       Relevant Orders   CBC   COMPLETE METABOLIC PANEL WITH GFR       Follow up plan: Return in about 1 year (around 11/13/2019) for follow-up visit with Dr. Sanda Klein.  An after-visit summary was printed and given to the patient at [facility].  Please see the patient instructions which may  contain other information and recommendations beyond what is mentioned above in the assessment and plan.  No orders of the defined types were placed in this encounter.   Orders Placed This Encounter  Procedures  . CBC  .  COMPLETE METABOLIC PANEL WITH GFR  . Hemoglobin A1c  . Lipid panel  . TSH

## 2018-11-12 NOTE — Assessment & Plan Note (Signed)
Check TSH yearly

## 2018-11-12 NOTE — Patient Instructions (Signed)
[name] , Thank you for taking time to come for your Medicare Wellness Visit. I appreciate your ongoing commitment to your health goals. Please review the following plan we discussed and let me know if I can assist you in the future.   Screening recommendations/referrals: Colonoscopy: done 09/04/10 repeat in 2021 Recommended yearly ophthalmology/optometry visit for glaucoma screening and checkup Recommended yearly dental visit for hygiene and checkup  Vaccinations: Influenza vaccine: done 08/24/18 Pneumococcal vaccine: done 09/14/14 Tdap vaccine: due - please contact us if you get a cut or scrape Shingles vaccine: Shingrix completed 09/04/18    Advanced directives: Please bring a copy of your health care power of attorney and living will to the office at your convenience.  Conditions/risks identified: Recommend continuing healthy eating and physical activity.   Next appointment: Please follow up in one year for your Medicare Annual Wellness visit.    Preventive Care 72 Years and Older, Male Preventive care refers to lifestyle choices and visits with your health care provider that can promote health and wellness. What does preventive care include?  A yearly physical exam. This is also called an annual well check.  Dental exams once or twice a year.  Routine eye exams. Ask your health care provider how often you should have your eyes checked.  Personal lifestyle choices, including:  Daily care of your teeth and gums.  Regular physical activity.  Eating a healthy diet.  Avoiding tobacco and drug use.  Limiting alcohol use.  Practicing safe sex.  Taking low doses of aspirin every day.  Taking vitamin and mineral supplements as recommended by your health care provider. What happens during an annual well check? The services and screenings done by your health care provider during your annual well check will depend on your age, overall health, lifestyle risk factors, and  family history of disease. Counseling  Your health care provider may ask you questions about your:  Alcohol use.  Tobacco use.  Drug use.  Emotional well-being.  Home and relationship well-being.  Sexual activity.  Eating habits.  History of falls.  Memory and ability to understand (cognition).  Work and work Statistician. Screening  You may have the following tests or measurements:  Height, weight, and BMI.  Blood pressure.  Lipid and cholesterol levels. These may be checked every 5 years, or more frequently if you are over 62 years old.  Skin check.  Lung cancer screening. You may have this screening every year starting at age 72 if you have a 30-pack-year history of smoking and currently smoke or have quit within the past 15 years.  Fecal occult blood test (FOBT) of the stool. You may have this test every year starting at age 72.  Flexible sigmoidoscopy or colonoscopy. You may have a sigmoidoscopy every 5 years or a colonoscopy every 10 years starting at age 72.  Prostate cancer screening. Recommendations will vary depending on your family history and other risks.  Hepatitis C blood test.  Hepatitis B blood test.  Sexually transmitted disease (STD) testing.  Diabetes screening. This is done by checking your blood sugar (glucose) after you have not eaten for a while (fasting). You may have this done every 1-3 years.  Abdominal aortic aneurysm (AAA) screening. You may need this if you are a current or former smoker.  Osteoporosis. You may be screened starting at age 72 if you are at high risk. Talk with your health care provider about your test results, treatment options, and if necessary, the need for  more tests. Vaccines  Your health care provider may recommend certain vaccines, such as:  Influenza vaccine. This is recommended every year.  Tetanus, diphtheria, and acellular pertussis (Tdap, Td) vaccine. You may need a Td booster every 10 years.  Zoster  vaccine. You may need this after age 62.  Pneumococcal 13-valent conjugate (PCV13) vaccine. One dose is recommended after age 72.  Pneumococcal polysaccharide (PPSV23) vaccine. One dose is recommended after age 72. Talk to your health care provider about which screenings and vaccines you need and how often you need them. This information is not intended to replace advice given to you by your health care provider. Make sure you discuss any questions you have with your health care provider. Document Released: 10/26/2015 Document Revised: 06/18/2016 Document Reviewed: 07/31/2015 Elsevier Interactive Patient Education  2017 [employer] Prevention in the Home Falls can cause injuries. They can happen to people of all ages. There are many things you can do to make your home safe and to help prevent falls. What can I do on the outside of my home?  Regularly fix the edges of walkways and driveways and fix any cracks.  Remove anything that might make you trip as you walk through a door, such as a raised step or threshold.  Trim any bushes or trees on the path to your home.  Use bright outdoor lighting.  Clear any walking paths of anything that might make someone trip, such as rocks or tools.  Regularly check to see if handrails are loose or broken. Make sure that both sides of any steps have handrails.  Any raised decks and porches should have guardrails on the edges.  Have any leaves, snow, or ice cleared regularly.  Use sand or salt on walking paths during winter.  Clean up any spills in your garage right away. This includes oil or grease spills. What can I do in the bathroom?  Use night lights.  Install grab bars by the toilet and in the tub and shower. Do not use towel bars as grab bars.  Use non-skid mats or decals in the tub or shower.  If you need to sit down in the shower, use a plastic, non-slip stool.  Keep the floor dry. Clean up any water that spills on the  floor as soon as it happens.  Remove soap buildup in the tub or shower regularly.  Attach bath mats securely with double-sided non-slip rug tape.  Do not have throw rugs and other things on the floor that can make you trip. What can I do in the bedroom?  Use night lights.  Make sure that you have a light by your bed that is easy to reach.  Do not use any sheets or blankets that are too big for your bed. They should not hang down onto the floor.  Have a firm chair that has side arms. You can use this for support while you get dressed.  Do not have throw rugs and other things on the floor that can make you trip. What can I do in the kitchen?  Clean up any spills right away.  Avoid walking on wet floors.  Keep items that you use a lot in easy-to-reach places.  If you need to reach something above you, use a strong step stool that has a grab bar.  Keep electrical cords out of the way.  Do not use floor polish or wax that makes floors slippery. If you must use wax, use non-skid  floor wax.  Do not have throw rugs and other things on the floor that can make you trip. What can I do with my stairs?  Do not leave any items on the stairs.  Make sure that there are handrails on both sides of the stairs and use them. Fix handrails that are broken or loose. Make sure that handrails are as long as the stairways.  Check any carpeting to make sure that it is firmly attached to the stairs. Fix any carpet that is loose or worn.  Avoid having throw rugs at the top or bottom of the stairs. If you do have throw rugs, attach them to the floor with carpet tape.  Make sure that you have a light switch at the top of the stairs and the bottom of the stairs. If you do not have them, ask someone to add them for you. What else can I do to help prevent falls?  Wear shoes that:  Do not have high heels.  Have rubber bottoms.  Are comfortable and fit you well.  Are closed at the toe. Do not wear  sandals.  If you use a stepladder:  Make sure that it is fully opened. Do not climb a closed stepladder.  Make sure that both sides of the stepladder are locked into place.  Ask someone to hold it for you, if possible.  Clearly mark and make sure that you can see:  Any grab bars or handrails.  First and last steps.  Where the edge of each step is.  Use tools that help you move around (mobility aids) if they are needed. These include:  Canes.  Walkers.  Scooters.  Crutches.  Turn on the lights when you go into a dark area. Replace any light bulbs as soon as they burn out.  Set up your furniture so you have a clear path. Avoid moving your furniture around.  If any of your floors are uneven, fix them.  If there are any pets around you, be aware of where they are.  Review your medicines with your doctor. Some medicines can make you feel dizzy. This can increase your chance of falling. Ask your doctor what other things that you can do to help prevent falls. This information is not intended to replace advice given to you by your health care provider. Make sure you discuss any questions you have with your health care provider. Document Released: 07/26/2009 Document Revised: 03/06/2016 Document Reviewed: 11/03/2014 Elsevier Interactive Patient Education  2017 [employer].

## 2018-11-12 NOTE — Patient Instructions (Signed)
We'll have you return for labs another day If you have not heard anything from my staff in a week about any orders/referrals/studies from today, please contact us here to follow-up [phone] Do try to cut back Try to use PLAIN allergy medicine without the decongestant Avoid: phenylephrine, phenylpropanolamine, and pseudoephredine

## 2018-11-12 NOTE — Assessment & Plan Note (Signed)
Encouraged decreasing alcohol, he declined assistance

## 2018-11-12 NOTE — Assessment & Plan Note (Signed)
Check glucose and A1c 

## 2018-11-12 NOTE — Assessment & Plan Note (Signed)
Continue statin; continue aspirin; limit saturated fats

## 2018-11-12 NOTE — Addendum Note (Signed)
Addended by: LADA, Satira Anis on: 11/12/2018 06:01 PM   Modules accepted: Level of Service

## 2018-11-12 NOTE — Assessment & Plan Note (Signed)
antihsitamine and saline rinses PRN; avoid decongestants

## 2018-11-18 DIAGNOSIS — E782 Mixed hyperlipidemia: Secondary | ICD-10-CM | POA: Diagnosis not present

## 2018-11-18 DIAGNOSIS — R739 Hyperglycemia, unspecified: Secondary | ICD-10-CM | POA: Diagnosis not present

## 2018-11-18 DIAGNOSIS — Z5181 Encounter for therapeutic drug level monitoring: Secondary | ICD-10-CM | POA: Diagnosis not present

## 2018-11-18 DIAGNOSIS — R635 Abnormal weight gain: Secondary | ICD-10-CM | POA: Diagnosis not present

## 2018-11-18 NOTE — Progress Notes (Signed)
Greetings. Your complete blood count is normal. You have normal numbers of white blood cells, red blood cells, and platelets. The other labs are pending. Peace, Dr. Sanda Klein

## 2018-11-19 LAB — CBC
HCT: 44.5 % (ref 38.5–50.0)
Hemoglobin: 15.2 g/dL (ref 13.2–17.1)
MCH: 32.4 pg (ref 27.0–33.0)
MCHC: 34.2 g/dL (ref 32.0–36.0)
MCV: 94.9 fL (ref 80.0–100.0)
MPV: 10.2 fL (ref 7.5–12.5)
Platelets: 227 10*3/uL (ref 140–400)
RBC: 4.69 10*6/uL (ref 4.20–5.80)
RDW: 13.8 % (ref 11.0–15.0)
WBC: 5 10*3/uL (ref 3.8–10.8)

## 2018-11-19 LAB — HEMOGLOBIN A1C
Hgb A1c MFr Bld: 5.7 % of total Hgb — ABNORMAL HIGH (ref <5.7)
Mean Plasma Glucose: 117 (calc)
eAG (mmol/L): 6.5 (calc)

## 2018-11-19 LAB — COMPLETE METABOLIC PANEL WITH GFR
AG RATIO: 1.5 (calc) (ref 1.0–2.5)
ALBUMIN MSPROF: 4.1 g/dL (ref 3.6–5.1)
ALT: 27 U/L (ref 9–46)
AST: 26 U/L (ref 10–35)
Alkaline phosphatase (APISO): 94 U/L (ref 35–144)
BUN: 13 mg/dL (ref 7–25)
CALCIUM: 9.2 mg/dL (ref 8.6–10.3)
CO2: 28 mmol/L (ref 20–32)
CREATININE: 0.93 mg/dL (ref 0.70–1.18)
Chloride: 108 mmol/L (ref 98–110)
GFR, EST NON AFRICAN AMERICAN: 82 mL/min/{1.73_m2} (ref >=60)
GFR, Est African American: 95 mL/min/{1.73_m2} (ref >=60)
GLOBULIN: 2.7 g/dL (ref 1.9–3.7)
Glucose, Bld: 108 mg/dL — ABNORMAL HIGH (ref 65–99)
POTASSIUM: 4.2 mmol/L (ref 3.5–5.3)
Sodium: 143 mmol/L (ref 135–146)
Total Bilirubin: 0.8 mg/dL (ref 0.2–1.2)
Total Protein: 6.8 g/dL (ref 6.1–8.1)

## 2018-11-19 LAB — TSH: TSH: 1.47 m[IU]/L (ref 0.40–4.50)

## 2018-11-19 LAB — LIPID PANEL
CHOL/HDL RATIO: 2.8 (calc) (ref <5.0)
Cholesterol: 147 mg/dL (ref <200)
HDL: 52 mg/dL (ref >=40)
LDL Cholesterol (Calc): 76 mg/dL (calc)
NON-HDL CHOLESTEROL (CALC): 95 mg/dL (ref <130)
Triglycerides: 103 mg/dL (ref <150)

## 2019-02-12 ENCOUNTER — Other Ambulatory Visit: Payer: Self-pay | Admitting: Family Medicine

## 2019-06-13 DIAGNOSIS — H33312 Horseshoe tear of retina without detachment, left eye: Secondary | ICD-10-CM | POA: Diagnosis not present

## 2019-06-13 DIAGNOSIS — H33101 Unspecified retinoschisis, right eye: Secondary | ICD-10-CM | POA: Diagnosis not present

## 2019-07-19 ENCOUNTER — Other Ambulatory Visit: Payer: Self-pay

## 2019-07-19 ENCOUNTER — Ambulatory Visit (INDEPENDENT_AMBULATORY_CARE_PROVIDER_SITE_OTHER): Payer: Medicare Other

## 2019-07-19 DIAGNOSIS — Z23 Encounter for immunization: Secondary | ICD-10-CM

## 2019-08-08 DIAGNOSIS — Z961 Presence of intraocular lens: Secondary | ICD-10-CM | POA: Diagnosis not present

## 2019-09-05 DIAGNOSIS — Z85828 Personal history of other malignant neoplasm of skin: Secondary | ICD-10-CM | POA: Diagnosis not present

## 2019-09-05 DIAGNOSIS — D2262 Melanocytic nevi of left upper limb, including shoulder: Secondary | ICD-10-CM | POA: Diagnosis not present

## 2019-09-05 DIAGNOSIS — D2261 Melanocytic nevi of right upper limb, including shoulder: Secondary | ICD-10-CM | POA: Diagnosis not present

## 2019-09-05 DIAGNOSIS — L821 Other seborrheic keratosis: Secondary | ICD-10-CM | POA: Diagnosis not present

## 2019-09-05 DIAGNOSIS — Z08 Encounter for follow-up examination after completed treatment for malignant neoplasm: Secondary | ICD-10-CM | POA: Diagnosis not present

## 2019-09-05 DIAGNOSIS — D2272 Melanocytic nevi of left lower limb, including hip: Secondary | ICD-10-CM | POA: Diagnosis not present

## 2019-09-05 DIAGNOSIS — D225 Melanocytic nevi of trunk: Secondary | ICD-10-CM | POA: Diagnosis not present

## 2019-09-05 DIAGNOSIS — D2271 Melanocytic nevi of right lower limb, including hip: Secondary | ICD-10-CM | POA: Diagnosis not present

## 2019-11-08 ENCOUNTER — Telehealth: Payer: Self-pay | Admitting: Family Medicine

## 2019-11-08 NOTE — Telephone Encounter (Signed)
Pt needs appoitnment

## 2019-11-09 NOTE — Telephone Encounter (Signed)
Pt needs f/u appt 1 month given on refills, Pt may want to see new dr.

## 2019-11-09 NOTE — Telephone Encounter (Signed)
Pt has an appt with Leisa on 11/17/2019 and his annual Wellness. Pt is unable to schedule with new dr due to insurance purposes for now.

## 2019-11-17 ENCOUNTER — Other Ambulatory Visit: Payer: Self-pay

## 2019-11-17 ENCOUNTER — Ambulatory Visit: Payer: Medicare Other | Admitting: Family Medicine

## 2019-11-17 ENCOUNTER — Encounter: Payer: Self-pay | Admitting: Family Medicine

## 2019-11-17 ENCOUNTER — Ambulatory Visit (INDEPENDENT_AMBULATORY_CARE_PROVIDER_SITE_OTHER): Payer: Medicare Other | Admitting: Family Medicine

## 2019-11-17 ENCOUNTER — Ambulatory Visit (INDEPENDENT_AMBULATORY_CARE_PROVIDER_SITE_OTHER): Payer: Medicare Other

## 2019-11-17 VITALS — BP 112/70 | HR 84 | Temp 97.6°F | Resp 14 | Ht 73.0 in | Wt 209.5 lb

## 2019-11-17 DIAGNOSIS — Z125 Encounter for screening for malignant neoplasm of prostate: Secondary | ICD-10-CM

## 2019-11-17 DIAGNOSIS — E034 Atrophy of thyroid (acquired): Secondary | ICD-10-CM

## 2019-11-17 DIAGNOSIS — R7303 Prediabetes: Secondary | ICD-10-CM | POA: Diagnosis not present

## 2019-11-17 DIAGNOSIS — R739 Hyperglycemia, unspecified: Secondary | ICD-10-CM | POA: Diagnosis not present

## 2019-11-17 DIAGNOSIS — Z0000 Encounter for general adult medical examination without abnormal findings: Secondary | ICD-10-CM | POA: Diagnosis not present

## 2019-11-17 DIAGNOSIS — Z8042 Family history of malignant neoplasm of prostate: Secondary | ICD-10-CM

## 2019-11-17 DIAGNOSIS — Z1211 Encounter for screening for malignant neoplasm of colon: Secondary | ICD-10-CM

## 2019-11-17 DIAGNOSIS — Z5181 Encounter for therapeutic drug level monitoring: Secondary | ICD-10-CM

## 2019-11-17 DIAGNOSIS — E782 Mixed hyperlipidemia: Secondary | ICD-10-CM

## 2019-11-17 NOTE — Progress Notes (Signed)
Name: Marc Jackson   MRN: [id number]    DOB: March 19, 1946   Date:11/17/2019       Progress Note  Subjective:   Chief Complaint  Patient presents with  . Follow-up  . Medication Refill  . Hyperlipidemia  . Hypothyroidism    Marc Jackson is a 74 y.o. male, presents for virtual visit for routine follow up on the conditions listed above.  Hypothyroidism: Current Medication Regimen: 125 mcg synthroid Takes medicine first thing in the morning empty stomach Current Symptoms: denies fatigue, weight changes, heat/cold intolerance, bowel/skin changes Most recent results are below; we will be repeating labs today. Lab Results  Component Value Date   TSH 1.47 11/18/2018    Hyperlipidemia: Started one year ago?  Per pt, possibly started in 2019, lipid panel sig improved since 2019 Current Medication Regimen:  Lipitor 20 Last Lipids: Lab Results  Component Value Date   CHOL 147 11/18/2018   HDL 52 11/18/2018   LDLCALC 76 11/18/2018   TRIG 103 11/18/2018   CHOLHDL 2.8 11/18/2018  - Current Diet:  Normally very healthy, last year a little bit worse diet, lean meats, poultry, fresh fruits and vegetables - Denies: Chest pain, shortness of breath, myalgias. - Documented aortic atherosclerosis? No - Risk factors for atherosclerosis: hypercholesterolemia  2019 CT cardiac score for brief CP, imaging showed centrilobular emphysema, he has no SOB, wheeze or recurrent bronchitis.  He was cleared by cardiology   He's gained a little weight over the past year but overall feels well and good.   No other concerns or sx.   Patient Active Problem List   Diagnosis Date Noted  . Hyperglycemia 11/10/2017  . Alcohol use 11/23/2016  . Hyperlipidemia 11/11/2016  . Screening for prostate cancer 11/11/2016  . Family hx of prostate cancer 11/11/2016  . Hypothyroidism due to acquired atrophy of thyroid 11/09/2015  . History of skin cancer 11/09/2015  . Annual physical exam 11/09/2015  . Allergic  rhinitis with postnasal drip 11/09/2015  . Screening for STD (sexually transmitted disease) 11/09/2015  . Encounter for cholesteral screening for cardiovascular disease 11/09/2015  . Encounter for screening for malignant neoplasm of prostate 11/09/2015    Current Outpatient Medications:  .  aspirin EC 81 MG tablet, Take 81 mg by mouth 4 (four) times a week., Disp: , Rfl:  .  atorvastatin (LIPITOR) 20 MG tablet, TAKE ONE TABLET BY MOUTH EVERY NIGHT AT BEDTIME, Disp: 30 tablet, Rfl: 0 .  levothyroxine (SYNTHROID) 125 MCG tablet, TAKE ONE TABLET BY MOUTH DAILY, Disp: 30 tablet, Rfl: 0 Allergies  Allergen Reactions  . Tetanus Toxoids Swelling    Fever   . Penicillins Swelling and Rash    Has patient had a PCN reaction causing immediate rash, facial/tongue/throat swelling, SOB or lightheadedness with hypotension: yes Has patient had a PCN reaction causing severe rash involving mucus membranes or skin necrosis: no Has patient had a PCN reaction that required hospitalization: no Has patient had a PCN reaction occurring within the last 10 years: no If all of the above answers are "NO", then may proceed with Cephalosporin use.     Past Surgical History:  Procedure Laterality Date  . BACK SURGERY    . CATARACT EXTRACTION W/PHACO Right 11/24/2017   Procedure: CATARACT EXTRACTION PHACO AND INTRAOCULAR LENS PLACEMENT (IOC);  Surgeon: Birder Robson, MD;  Location: [facility] ORS;  Service: Ophthalmology;  Laterality: Right;  [place] 00:28.3 AP% 14.6 CDE 4.12 Fluid pack Lot # [id number]  . CATARACT EXTRACTION W/PHACO  Left 12/29/2017   Procedure: CATARACT EXTRACTION PHACO AND INTRAOCULAR LENS PLACEMENT (IOC);  Surgeon: Birder Robson, MD;  Location: [facility] ORS;  Service: Ophthalmology;  Laterality: Left;  [place] 00:30.4 AP% 14.0 CDE 4.25 Fluid Pack Lot # [id number]  . CO2 LASER OF [place]    . discetomy    . HERNIA REPAIR     X 2  . SKIN CANCER DESTRUCTION     Family History  Problem Relation Age of  Onset  . Cancer Father        prostate  . Heart disease Father   . Hypothyroidism Sister   . Heart disease Mother        CHF, atrial fibrillation  . Hyperlipidemia Mother        high TG  . Diabetes Brother   . Heart disease Brother   . Obesity Brother    Social History   Socioeconomic History  . Marital status: Married    Spouse name: Not on file  . Number of children: 0  . Years of education: Not on file  . Highest education level: Master's degree (e.g., MA, MS, MEng, MEd, MSW, MBA)  Occupational History  . Not on file  Tobacco Use  . Smoking status: Former Research scientist (life sciences)  . Smokeless tobacco: Never Used  . Tobacco comment: Quit 1985  Substance and Sexual Activity  . Alcohol use: Yes    Alcohol/week: 28.0 standard drinks    Types: 28 Glasses of wine per week  . Drug use: No  . Sexual activity: Yes    Partners: Female  Other Topics Concern  . 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 Difficulty of Paying Living Expenses: Not on file  Food Insecurity:   . Worried About Charity fundraiser in the Last Year: Not on file  . Ran Out of Food in the Last Year: Not on file  Transportation Needs:   . Lack of Transportation (Medical): Not on file  . Lack of Transportation (Non-Medical): Not on file  Physical Activity:   . Days of Exercise per Week: Not on file  . Minutes of Exercise per Session: Not on file  Stress:   . Feeling of Stress : Not on file  Social Connections:   . Frequency of Communication with Friends and Family: Not on file  . Frequency of Social Gatherings with Friends and Family: Not on file  . Attends Religious Services: Not on file  . Active Member of Clubs or Organizations: Not on file  . Attends Archivist Meetings: Not on file  . Marital Status: Not on file  Intimate Partner  Violence:   . Fear of Current or Ex-Partner: Not on file  . Emotionally Abused: Not on file  . Physically Abused: Not on file  . Sexually Abused: Not on file    Chart Review Today: I personally reviewed active problem list, medication list, allergies, family history, social history, health maintenance, notes from last encounter, lab results, imaging with the patient/caregiver today.  Review of Systems  Constitutional: Negative.   HENT: Negative.   Eyes: Negative.   Respiratory: Negative.   Cardiovascular: Negative.   Gastrointestinal: Negative.   Endocrine: Negative.   Genitourinary: Negative.   Musculoskeletal: Negative.   Skin: Negative.   Allergic/Immunologic: Negative.   Neurological: Negative.  Hematological: Negative.   Psychiatric/Behavioral: Negative.   All other systems reviewed and are negative.     Objective:    Virtual encounter, vitals limited, only able to obtain the following Today's Vitals   11/17/19 0815  BP: 112/70  Pulse: 84  Resp: 14  Temp: 97.6 F (36.4 C)  SpO2: 96%  Weight: 209 lb 8 oz (95 kg)  Height: 6\' 1"  ([id number] m)   Body mass index is 27.64 kg/m. Nursing Note and Vital Signs reviewed.  Physical Exam Vitals and nursing note reviewed.  Constitutional:      General: He is not in acute distress.    Appearance: Normal appearance. He is well-developed. He is not ill-appearing, toxic-appearing or diaphoretic.     Interventions: Face mask in place.  HENT:     Head: Normocephalic and atraumatic.     Jaw: No trismus.     Right Ear: External ear normal.     Left Ear: External ear normal.  Eyes:     General: Lids are normal. No scleral icterus.    Conjunctiva/sclera: Conjunctivae normal.     Pupils: Pupils are equal, round, and reactive to light.  Neck:     Trachea: Trachea and phonation normal. No tracheal deviation.  Cardiovascular:     Rate and Rhythm: Normal rate and regular rhythm.     Pulses: Normal pulses.          Radial  pulses are 2+ on the right side and 2+ on the left side.       Posterior tibial pulses are 2+ on the right side and 2+ on the left side.     Heart sounds: Normal heart sounds. No murmur. No friction rub. No gallop.   Pulmonary:     Effort: Pulmonary effort is normal. No respiratory distress.     Breath sounds: Normal breath sounds. No stridor. No wheezing, rhonchi or rales.  Abdominal:     General: Bowel sounds are normal. There is no distension.     Palpations: Abdomen is soft.     Tenderness: There is no abdominal tenderness. There is no guarding or rebound.  Musculoskeletal:        General: Normal range of motion.     Cervical back: Normal range of motion and neck supple.     Right lower leg: No edema.     Left lower leg: No edema.  Skin:    General: Skin is warm and dry.     Capillary Refill: Capillary refill takes less than 2 seconds.     Coloration: Skin is not jaundiced.     Findings: No rash.     Nails: There is no clubbing.  Neurological:     Mental Status: He is alert.     Cranial Nerves: No dysarthria or facial asymmetry.     Motor: No tremor or abnormal muscle tone.     Gait: Gait normal.  Psychiatric:        Mood and Affect: Mood normal.        Speech: Speech normal.        Behavior: Behavior normal. Behavior is cooperative.      PE limited by telephone encounter  No results found for this or any previous visit (from the past 72 hour(s)).  PHQ2/9: Depression screen [facility] 2/9 11/17/2019 11/12/2018 11/10/2017 11/11/2016 11/09/2015  Decreased Interest 0 0 0 0 0  Down, Depressed, Hopeless 0 0 0 0 0  PHQ - 2 Score 0 0 0 0 0  Altered sleeping 0 - - - -  Tired, decreased energy 0 - - - -  Change in appetite 0 - - - -  Feeling bad or failure about yourself  0 - - - -  Trouble concentrating 0 - - - -  Moving slowly or fidgety/restless 0 - - - -  Suicidal thoughts 0 - - - -  PHQ-9 Score 0 - - - -  Difficult doing work/chores Not difficult at all - - - -   PHQ-2/9  Result is neg, reviewed today  Fall Risk: Fall Risk  11/17/2019 11/12/2018 11/10/2017 11/11/2016 11/09/2015  Falls in the past year? 0 0 No No No  Number falls in past yr: 0 0 - - -  Injury with Fall? 0 0 - - -  Follow up - Falls prevention discussed - - -     Assessment and Plan:       ICD-10-CM   1. Hypothyroidism due to acquired atrophy of thyroid  E03.4 CBC with Differential/Platelet    COMPLETE METABOLIC PANEL WITH GFR    TSH   doing well, stable, recheck labs  2. Mixed hyperlipidemia  99991111 COMPLETE METABOLIC PANEL WITH GFR    Lipid panel   stable, well controlled, tolerating statin  3. Hyperglycemia  R73.9    recheck A1C  4. Prediabetes  R73.03 Hemoglobin A1c   recheck labs  5. Screening for malignant neoplasm of prostate  Z12.5   6. Family history of prostate cancer in father  Z60.42 PSA  7. Screen for colon cancer  Z12.11 Ambulatory referral to Gastroenterology  8. Encounter for medication monitoring  Z51.81 CBC with Differential/Platelet    COMPLETE METABOLIC PANEL WITH GFR    Lipid panel    TSH    Hemoglobin A1c   F/up in 6 months  Delsa Grana, PA-C 11/17/19 8:23 AM

## 2019-11-17 NOTE — Patient Instructions (Signed)
Marc Jackson , Thank you for taking time to come for your Medicare Wellness Visit. I appreciate your ongoing commitment to your health goals. Please review the following plan we discussed and let me know if I can assist you in the future.   Screening recommendations/referrals: Colonoscopy: done 2011. Referral to gastroenterology sent today.  Recommended yearly ophthalmology/optometry visit for glaucoma screening and checkup Recommended yearly dental visit for hygiene and checkup  Vaccinations: Influenza vaccine: done 07/19/19 Pneumococcal vaccine: done 09/14/14 Shingles vaccine: Shingrix completed 09/04/18    Advanced directives: Please bring a copy of your health care power of attorney and living will to the office at your convenience.  Conditions/risks identified: Keep up the great work!  Next appointment: Please follow up in one year for your Medicare Annual Wellness visit.    Preventive Care 36 Years and Older, Male Preventive care refers to lifestyle choices and visits with your health care provider that can promote health and wellness. What does preventive care include?  A yearly physical exam. This is also called an annual well check.  Dental exams once or twice a year.  Routine eye exams. Ask your health care provider how often you should have your eyes checked.  Personal lifestyle choices, including:  Daily care of your teeth and gums.  Regular physical activity.  Eating a healthy diet.  Avoiding tobacco and drug use.  Limiting alcohol use.  Practicing safe sex.  Taking low doses of aspirin every day.  Taking vitamin and mineral supplements as recommended by your health care provider. What happens during an annual well check? The services and screenings done by your health care provider during your annual well check will depend on your age, overall health, lifestyle risk factors, and family history of disease. Counseling  Your health care provider may ask you  questions about your:  Alcohol use.  Tobacco use.  Drug use.  Emotional well-being.  Home and relationship well-being.  Sexual activity.  Eating habits.  History of falls.  Memory and ability to understand (cognition).  Work and work Statistician. Screening  You may have the following tests or measurements:  Height, weight, and BMI.  Blood pressure.  Lipid and cholesterol levels. These may be checked every 5 years, or more frequently if you are over 27 years old.  Skin check.  Lung cancer screening. You may have this screening every year starting at age 48 if you have a 30-pack-year history of smoking and currently smoke or have quit within the past 15 years.  Fecal occult blood test (FOBT) of the stool. You may have this test every year starting at age 58.  Flexible sigmoidoscopy or colonoscopy. You may have a sigmoidoscopy every 5 years or a colonoscopy every 10 years starting at age 87.  Prostate cancer screening. Recommendations will vary depending on your family history and other risks.  Hepatitis C blood test.  Hepatitis B blood test.  Sexually transmitted disease (STD) testing.  Diabetes screening. This is done by checking your blood sugar (glucose) after you have not eaten for a while (fasting). You may have this done every 1-3 years.  Abdominal aortic aneurysm (AAA) screening. You may need this if you are a current or former smoker.  Osteoporosis. You may be screened starting at age 12 if you are at high risk. Talk with your health care provider about your test results, treatment options, and if necessary, the need for more tests. Vaccines  Your health care provider may recommend certain vaccines, such as:  Influenza vaccine. This is recommended every year.  Tetanus, diphtheria, and acellular pertussis (Tdap, Td) vaccine. You may need a Td booster every 10 years.  Zoster vaccine. You may need this after age 19.  Pneumococcal 13-valent conjugate  (PCV13) vaccine. One dose is recommended after age 33.  Pneumococcal polysaccharide (PPSV23) vaccine. One dose is recommended after age 84. Talk to your health care provider about which screenings and vaccines you need and how often you need them. This information is not intended to replace advice given to you by your health care provider. Make sure you discuss any questions you have with your health care provider. Document Released: 10/26/2015 Document Revised: 06/18/2016 Document Reviewed: 07/31/2015 Elsevier Interactive Patient Education  2017 [employer] Prevention in the Home Falls can cause injuries. They can happen to people of all ages. There are many things you can do to make your home safe and to help prevent falls. What can I do on the outside of my home?  Regularly fix the edges of walkways and driveways and fix any cracks.  Remove anything that might make you trip as you walk through a door, such as a raised step or threshold.  Trim any bushes or trees on the path to your home.  Use bright outdoor lighting.  Clear any walking paths of anything that might make someone trip, such as rocks or tools.  Regularly check to see if handrails are loose or broken. Make sure that both sides of any steps have handrails.  Any raised decks and porches should have guardrails on the edges.  Have any leaves, snow, or ice cleared regularly.  Use sand or salt on walking paths during winter.  Clean up any spills in your garage right away. This includes oil or grease spills. What can I do in the bathroom?  Use night lights.  Install grab bars by the toilet and in the tub and shower. Do not use towel bars as grab bars.  Use non-skid mats or decals in the tub or shower.  If you need to sit down in the shower, use a plastic, non-slip stool.  Keep the floor dry. Clean up any water that spills on the floor as soon as it happens.  Remove soap buildup in the tub or shower  regularly.  Attach bath mats securely with double-sided non-slip rug tape.  Do not have throw rugs and other things on the floor that can make you trip. What can I do in the bedroom?  Use night lights.  Make sure that you have a light by your bed that is easy to reach.  Do not use any sheets or blankets that are too big for your bed. They should not hang down onto the floor.  Have a firm chair that has side arms. You can use this for support while you get dressed.  Do not have throw rugs and other things on the floor that can make you trip. What can I do in the kitchen?  Clean up any spills right away.  Avoid walking on wet floors.  Keep items that you use a lot in easy-to-reach places.  If you need to reach something above you, use a strong step stool that has a grab bar.  Keep electrical cords out of the way.  Do not use floor polish or wax that makes floors slippery. If you must use wax, use non-skid floor wax.  Do not have throw rugs and other things on the floor that  can make you trip. What can I do with my stairs?  Do not leave any items on the stairs.  Make sure that there are handrails on both sides of the stairs and use them. Fix handrails that are broken or loose. Make sure that handrails are as long as the stairways.  Check any carpeting to make sure that it is firmly attached to the stairs. Fix any carpet that is loose or worn.  Avoid having throw rugs at the top or bottom of the stairs. If you do have throw rugs, attach them to the floor with carpet tape.  Make sure that you have a light switch at the top of the stairs and the bottom of the stairs. If you do not have them, ask someone to add them for you. What else can I do to help prevent falls?  Wear shoes that:  Do not have high heels.  Have rubber bottoms.  Are comfortable and fit you well.  Are closed at the toe. Do not wear sandals.  If you use a stepladder:  Make sure that it is fully  opened. Do not climb a closed stepladder.  Make sure that both sides of the stepladder are locked into place.  Ask someone to hold it for you, if possible.  Clearly mark and make sure that you can see:  Any grab bars or handrails.  First and last steps.  Where the edge of each step is.  Use tools that help you move around (mobility aids) if they are needed. These include:  Canes.  Walkers.  Scooters.  Crutches.  Turn on the lights when you go into a dark area. Replace any light bulbs as soon as they burn out.  Set up your furniture so you have a clear path. Avoid moving your furniture around.  If any of your floors are uneven, fix them.  If there are any pets around you, be aware of where they are.  Review your medicines with your doctor. Some medicines can make you feel dizzy. This can increase your chance of falling. Ask your doctor what other things that you can do to help prevent falls. This information is not intended to replace advice given to you by your health care provider. Make sure you discuss any questions you have with your health care provider. Document Released: 07/26/2009 Document Revised: 03/06/2016 Document Reviewed: 11/03/2014 Elsevier Interactive Patient Education  2017 [employer].

## 2019-11-17 NOTE — Progress Notes (Signed)
Subjective:   Marc Jackson is a 74 y.o. male who presents for Medicare Annual/Subsequent preventive examination.  Review of Systems:   Cardiac Risk Factors include: advanced age (>21men, >29 women);dyslipidemia;male gender     Objective:    Vitals: BP 112/70   Pulse 84   Temp 97.6 F (36.4 C) (Temporal)   Resp 14   Ht 6\' 1"  (1.854 m)   Wt 209 lb 8 oz (95 kg)   SpO2 96%   BMI 27.64 kg/m   Body mass index is 27.64 kg/m.  Advanced Directives 11/17/2019 11/12/2018 10/06/2018 11/24/2017 11/11/2016 11/09/2015 05/03/2015  Does Patient Have a Medical Advance Directive? Yes Yes No Yes Yes Yes Yes  Type of Paramedic of [place];Living will [place];Living will - [place];Living will [place];Living will Living will [place];Living will  Copy of [place] in Chart? No - copy requested No - copy requested - No - copy requested - No - copy requested -    Tobacco Social History   Tobacco Use  Smoking Status Former Smoker  Smokeless Tobacco Never Used  Tobacco Comment   Quit 1985     Counseling given: Not Answered Comment: Quit 1985   Clinical Intake:  Pre-visit preparation completed: Yes  Pain : No/denies pain     BMI - recorded: 27.64 Nutritional Status: BMI 25 -29 Overweight Nutritional Risks: None Diabetes: No  How often do you need to have someone help you when you read instructions, pamphlets, or other written materials from your doctor or pharmacy?: 1 - Never  Interpreter Needed?: No  Information entered by :: Clemetine Marker LPN  Past Medical History:  Diagnosis Date  . Cancer ([place])    SKIN  . Cataract   . Hyperlipidemia   . Hypothyroidism   . IBS (irritable bowel syndrome)   . Thyroid disease    Past Surgical History:  Procedure Laterality Date  . BACK SURGERY    . CATARACT EXTRACTION W/PHACO Right 11/24/2017   Procedure: CATARACT  EXTRACTION PHACO AND INTRAOCULAR LENS PLACEMENT (IOC);  Surgeon: Birder Robson, MD;  Location: [facility] ORS;  Service: Ophthalmology;  Laterality: Right;  [place] 00:28.3 AP% 14.6 CDE 4.12 Fluid pack Lot # [id number]  . CATARACT EXTRACTION W/PHACO Left 12/29/2017   Procedure: CATARACT EXTRACTION PHACO AND INTRAOCULAR LENS PLACEMENT (IOC);  Surgeon: Birder Robson, MD;  Location: [facility] ORS;  Service: Ophthalmology;  Laterality: Left;  [place] 00:30.4 AP% 14.0 CDE 4.25 Fluid Pack Lot # [id number]  . CO2 LASER OF [place]    . discetomy    . HERNIA REPAIR     X 2  . SKIN CANCER DESTRUCTION     Family History  Problem Relation Age of Onset  . Cancer Father        prostate  . Heart disease Father   . Hypothyroidism Sister   . Heart disease Mother        CHF, atrial fibrillation  . Hyperlipidemia Mother        high TG  . Diabetes Brother   . Heart disease Brother   . Obesity Brother    Social History   Socioeconomic History  . Marital status: Married    Spouse name: Not on file  . Number of children: 0  . Years of education: Not on file  . Highest education level: Master's degree (e.g., MA, MS, MEng, MEd, MSW, MBA)  Occupational History  . Not on  file  Tobacco Use  . Smoking status: Former Research scientist (life sciences)  . Smokeless tobacco: Never Used  . Tobacco comment: Quit 1985  Substance and Sexual Activity  . Alcohol use: Yes    Alcohol/week: 28.0 standard drinks    Types: 28 Glasses of wine per week  . Drug use: No  . Sexual activity: Yes    Partners: Female  Other Topics Concern  . 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 Difficulty of Paying Living Expenses: Not hard at all  Food Insecurity: No Food Insecurity  . Worried About Charity fundraiser in the Last Year: Never true  . Ran Out of Food in the Last  Year: Never true  Transportation Needs: No Transportation Needs  . Lack of Transportation (Medical): No  . Lack of Transportation (Non-Medical): No  Physical Activity: Insufficiently Active  . Days of Exercise per Week: 4 days  . Minutes of Exercise per Session: 30 min  Stress: No Stress Concern Present  . Feeling of Stress : Only a little  Social Connections: Slightly Isolated  . Frequency of Communication with Friends and Family: More than three times a week  . Frequency of Social Gatherings with Friends and Family: Once a week  . Attends Religious Services: More than 4 times per year  . Active Member of Clubs or Organizations: No  . Attends Archivist Meetings: Never  . Marital Status: Married    Outpatient Encounter Medications as of 11/17/2019  Medication Sig  . aspirin EC 81 MG tablet Take 81 mg by mouth 4 (four) times a week.  Marland Kitchen atorvastatin (LIPITOR) 20 MG tablet TAKE ONE TABLET BY MOUTH EVERY NIGHT AT BEDTIME  . levothyroxine (SYNTHROID) 125 MCG tablet TAKE ONE TABLET BY MOUTH DAILY   No facility-administered encounter medications on file as of 11/17/2019.    Activities of Daily Living In your present state of health, do you have any difficulty performing the following activities: 11/17/2019 11/17/2019  Hearing? N N  Comment declines hearing aids -  Vision? N N  Difficulty concentrating or making decisions? N N  Walking or climbing stairs? N N  Dressing or bathing? N N  Doing errands, shopping? N N  Preparing Food and eating ? N -  Using the Toilet? N -  In the past six months, have you accidently leaked urine? N -  Do you have problems with loss of bowel control? N -  Managing your Medications? N -  Managing your Finances? N -  Housekeeping or managing your Housekeeping? N -  Some recent data might be hidden    Patient Care Team: Delsa Grana, PA-C as PCP - General (Family Medicine) Dasher, Rayvon Char, MD (Dermatology)   Assessment:   This is a routine  wellness examination for Marc Jackson.  Exercise Activities and Dietary recommendations Current Exercise Habits: Home exercise routine, Type of exercise: Other - see comments;walking(exercise bike), Time (Minutes): 30, Frequency (Times/Week): 4, Weekly Exercise (Minutes/Week): 120, Intensity: Moderate, Exercise limited by: None identified  Goals    . Increase physical activity     Recommend exercising 150 minutes per week    . Weight (lb) < 189 lb 6.4 oz (85.9 kg)       Fall Risk Fall Risk  11/17/2019 11/17/2019 11/12/2018 11/10/2017 11/11/2016  Falls  in the past year? 0 0 0 No No  Number falls in past yr: 0 0 0 - -  Injury with Fall? 0 0 0 - -  Risk for fall due to : No Fall Risks - - - -  Follow up Falls prevention discussed - Falls prevention discussed - -   FALL RISK PREVENTION PERTAINING TO THE HOME:  Any stairs in or around the home? Yes  If so, do they handrails? Yes   Home free of loose throw rugs in walkways, pet beds, electrical cords, etc? Yes  Adequate lighting in your home to reduce risk of falls? Yes   ASSISTIVE DEVICES UTILIZED TO PREVENT FALLS:  Life alert? No  Use of a cane, walker or w/c? No  Grab bars in the bathroom? Yes  Shower chair or bench in shower? No  Elevated toilet seat or a handicapped toilet? No   DME ORDERS:  DME order needed?  No   TIMED UP AND GO:  Was the test performed? Yes .  Length of time to ambulate 10 feet: 5 sec.   GAIT:  Appearance of gait: Gait stead-fast and without the use of an assistive device.    Education: Fall risk prevention has been discussed.  Intervention(s) required? No   Depression Screen PHQ 2/9 Scores 11/17/2019 11/17/2019 11/12/2018 11/10/2017  PHQ - 2 Score 0 0 0 0  PHQ- 9 Score - 0 - -    Cognitive Function - 6CIT deferred for 2021 AWV, pt has no memory issues.      6CIT Screen 11/12/2018 11/10/2017  What Year? 0 points 0 points  What month? 0 points 0 points  What time? 0 points 0 points  Count back from 20 0  points 0 points  Months in reverse 0 points 0 points  Repeat phrase 0 points 0 points  Total Score 0 0    Immunization History  Administered Date(s) Administered  . Fluad Quad(high Dose 65+) 07/19/2019  . Influenza, High Dose Seasonal PF 08/24/2018  . Influenza-Unspecified 08/01/2015, 07/30/2016, 07/30/2017  . Pneumococcal Conjugate-13 09/14/2014  . Pneumococcal-Unspecified 08/08/2013  . Zoster 07/13/2010  . Zoster Recombinat (Shingrix) 05/16/2018, 09/04/2018    Qualifies for Shingles Vaccine? Yes  Shingrix series completed.   Tdap: n/a pt allergic to tetanus  Flu Vaccine: Up to date  Pneumococcal Vaccine: Up to date   Screening Tests Health Maintenance  Topic Date Due  . COLONOSCOPY  10/14/2019  . INFLUENZA VACCINE  Completed  . Hepatitis C Screening  Completed  . PNA vac Low Risk Adult  Completed  . TETANUS/TDAP  Discontinued   Cancer Screenings:  Colorectal Screening: Completed 2011. Repeat every 10 years; Delsa Grana PAC ordered today.   Lung Cancer Screening: (Low Dose CT Chest recommended if Age 54-80 years, 30 pack-year currently smoking OR have quit w/in 15years.) does not qualify.    Additional Screening:  Hepatitis C Screening: does qualify; Completed 11/09/15  Vision Screening: Recommended annual ophthalmology exams for early detection of glaucoma and other disorders of the eye. Is the patient up to date with their annual eye exam?  Yes  Who is the provider or what is the name of the office in which the pt attends annual eye exams? [place] Screening: Recommended annual dental exams for proper oral hygiene  Community Resource Referral:  CRR required this visit?  No       Plan:    I have personally reviewed and addressed the Medicare Annual Wellness questionnaire  and have noted the following in the patient's chart:  A. Medical and social history B. Use of alcohol, tobacco or illicit drugs  C. Current medications and  supplements D. Functional ability and status E.  Nutritional status F.  Physical activity G. Advance directives H. List of other physicians I.  Hospitalizations, surgeries, and ER visits in previous 12 months J.  [place] such as hearing and vision if needed, cognitive and depression L. Referrals and appointments   In addition, I have reviewed and discussed with patient certain preventive protocols, quality metrics, and best practice recommendations. A written personalized care plan for preventive services as well as general preventive health recommendations were provided to patient.   Signed,  Clemetine Marker, LPN Nurse Health Advisor   Nurse Notes: pt doing well and appreciative of visit today

## 2019-11-18 LAB — LIPID PANEL
Cholesterol: 127 mg/dL (ref <200)
HDL: 50 mg/dL (ref >=40)
LDL Cholesterol (Calc): 63 mg/dL (calc)
Non-HDL Cholesterol (Calc): 77 mg/dL (calc) (ref <130)
Total CHOL/HDL Ratio: 2.5 (calc) (ref <5.0)
Triglycerides: 67 mg/dL (ref <150)

## 2019-11-18 LAB — CBC WITH DIFFERENTIAL/PLATELET
Absolute Monocytes: 412 cells/uL (ref 200–950)
Basophils Absolute: 29 cells/uL (ref 0–200)
Basophils Relative: 0.7 %
Eosinophils Absolute: 294 cells/uL (ref 15–500)
Eosinophils Relative: 7 %
HCT: 41.6 % (ref 38.5–50.0)
Hemoglobin: 13.9 g/dL (ref 13.2–17.1)
Lymphs Abs: 1239 cells/uL (ref 850–3900)
MCH: 31.6 pg (ref 27.0–33.0)
MCHC: 33.4 g/dL (ref 32.0–36.0)
MCV: 94.5 fL (ref 80.0–100.0)
MPV: 10.1 fL (ref 7.5–12.5)
Monocytes Relative: 9.8 %
Neutro Abs: 2226 cells/uL (ref 1500–7800)
Neutrophils Relative %: 53 %
Platelets: 231 10*3/uL (ref 140–400)
RBC: 4.4 10*6/uL (ref 4.20–5.80)
RDW: 12.4 % (ref 11.0–15.0)
Total Lymphocyte: 29.5 %
WBC: 4.2 10*3/uL (ref 3.8–10.8)

## 2019-11-18 LAB — PSA: PSA: 0.7 ng/mL (ref <=4.0)

## 2019-11-18 LAB — COMPLETE METABOLIC PANEL WITH GFR
AG Ratio: 1.7 (calc) (ref 1.0–2.5)
ALT: 25 U/L (ref 9–46)
AST: 24 U/L (ref 10–35)
Albumin: 4.2 g/dL (ref 3.6–5.1)
Alkaline phosphatase (APISO): 86 U/L (ref 35–144)
BUN: 12 mg/dL (ref 7–25)
CO2: 27 mmol/L (ref 20–32)
Calcium: 9.5 mg/dL (ref 8.6–10.3)
Chloride: 110 mmol/L (ref 98–110)
Creat: 0.99 mg/dL (ref 0.70–1.18)
GFR, Est African American: 88 mL/min/{1.73_m2} (ref >=60)
GFR, Est Non African American: 76 mL/min/{1.73_m2} (ref >=60)
Globulin: 2.5 g/dL (calc) (ref 1.9–3.7)
Glucose, Bld: 113 mg/dL — ABNORMAL HIGH (ref 65–99)
Potassium: 5.6 mmol/L — ABNORMAL HIGH (ref 3.5–5.3)
Sodium: 145 mmol/L (ref 135–146)
Total Bilirubin: 0.6 mg/dL (ref 0.2–1.2)
Total Protein: 6.7 g/dL (ref 6.1–8.1)

## 2019-11-18 LAB — HEMOGLOBIN A1C
Hgb A1c MFr Bld: 5.7 % of total Hgb — ABNORMAL HIGH (ref <5.7)
Mean Plasma Glucose: 117 (calc)
eAG (mmol/L): 6.5 (calc)

## 2019-11-18 LAB — TSH: TSH: 0.5 mIU/L (ref 0.40–4.50)

## 2019-11-21 ENCOUNTER — Other Ambulatory Visit: Payer: Self-pay | Admitting: Family Medicine

## 2019-11-21 DIAGNOSIS — E875 Hyperkalemia: Secondary | ICD-10-CM

## 2019-11-21 MED ORDER — ATORVASTATIN CALCIUM 20 MG PO TABS: 20.0000 mg | ORAL_TABLET | Freq: Every day | ORAL | 3 refills | Status: DC

## 2019-11-21 MED ORDER — LEVOTHYROXINE SODIUM 125 MCG PO TABS: 125.0000 ug | ORAL_TABLET | Freq: Every day | ORAL | 3 refills | Status: DC

## 2019-11-25 ENCOUNTER — Telehealth: Payer: Self-pay

## 2019-11-25 NOTE — Telephone Encounter (Signed)
Patient would like to be contacted in May to discuss scheduling his colonoscopy.  He plans on getting the COVID vaccine and would like to wait to get that done.  Created a reminder to call him in May.  Thanks,  [place], [place]

## 2019-12-03 ENCOUNTER — Ambulatory Visit: Payer: Medicare Other | Attending: Internal Medicine

## 2019-12-03 DIAGNOSIS — Z23 Encounter for immunization: Secondary | ICD-10-CM | POA: Insufficient documentation

## 2019-12-03 NOTE — Progress Notes (Signed)
   Covid-19 Vaccination Clinic  Name:  Marc Jackson    MRN: [id number] DOB: Feb 07, 1946  12/03/2019  [name] was observed post Covid-19 immunization for 15 minutes without incidence. He was provided with Vaccine Information Sheet and instruction to access the V-Safe system.   [name] was instructed to call 911 with any severe reactions post vaccine: Marland Kitchen Difficulty breathing  . Swelling of your face and throat  . A fast heartbeat  . A bad rash all over your body  . Dizziness and weakness    Immunizations Administered    Name Date Dose VIS Date Route   Pfizer COVID-19 Vaccine 12/03/2019  3:36 PM 0.3 mL 09/23/2019 Intramuscular   Manufacturer: [place]   Lot: [id number]   [place]: [id number]

## 2019-12-13 ENCOUNTER — Other Ambulatory Visit: Payer: Self-pay

## 2019-12-13 ENCOUNTER — Telehealth: Payer: Self-pay

## 2019-12-13 DIAGNOSIS — Z1211 Encounter for screening for malignant neoplasm of colon: Secondary | ICD-10-CM

## 2019-12-13 NOTE — Telephone Encounter (Signed)
Gastroenterology Pre-Procedure Review  Request Date: Friday 02/10/20 Requesting Physician: Dr. Vicente Males  PATIENT REVIEW QUESTIONS: The patient responded to the following health history questions as indicated:    1. Are you having any GI issues? yes (Pt states he has IBS) 2. Do you have a personal history of Polyps? no 3. Do you have a family history of Colon Cancer or Polyps? no 4. Diabetes Mellitus? no 5. Joint replacements in the past 12 months?no 6. Major health problems in the past 3 months?no 7. Any artificial heart valves, MVP, or defibrillator?no    MEDICATIONS & ALLERGIES:    Patient reports the following regarding taking any anticoagulation/antiplatelet therapy:   Plavix, Coumadin, Eliquis, Xarelto, Lovenox, Pradaxa, Brilinta, or Effient? no Aspirin? yes (81 mg daily)  Patient confirms/reports the following medications:  Current Outpatient Medications  Medication Sig Dispense Refill  . aspirin EC 81 MG tablet Take 81 mg by mouth 4 (four) times a week.    Marland Kitchen atorvastatin (LIPITOR) 20 MG tablet Take 1 tablet (20 mg total) by mouth at bedtime. 90 tablet 3  . levothyroxine (SYNTHROID) 125 MCG tablet Take 1 tablet (125 mcg total) by mouth daily. Prior to breakfast 90 tablet 3   No current facility-administered medications for this visit.    Patient confirms/reports the following allergies:  Allergies  Allergen Reactions  . Tetanus Toxoids Swelling    Fever   . Penicillins Swelling and Rash    Has patient had a PCN reaction causing immediate rash, facial/tongue/throat swelling, SOB or lightheadedness with hypotension: yes Has patient had a PCN reaction causing severe rash involving mucus membranes or skin necrosis: no Has patient had a PCN reaction that required hospitalization: no Has patient had a PCN reaction occurring within the last 10 years: no If all of the above answers are "NO", then may proceed with Cephalosporin use.     No orders of the defined types were placed  in this encounter.   AUTHORIZATION INFORMATION Primary Insurance: 1D#: Group #:  Secondary Insurance: 1D#: Group #:  SCHEDULE INFORMATION: Date: April 30th, 2021 Time: Location:[facility]

## 2019-12-14 ENCOUNTER — Telehealth: Payer: Self-pay

## 2019-12-14 NOTE — Telephone Encounter (Signed)
Patient plans on emailing me his previous colonoscopy report to scan to his chart, and have readily available for Dr. Vicente Males.  Thanks,  [place], [place]

## 2019-12-19 ENCOUNTER — Encounter: Payer: Self-pay | Admitting: Gastroenterology

## 2019-12-20 ENCOUNTER — Ambulatory Visit: Payer: Self-pay

## 2019-12-20 NOTE — Telephone Encounter (Signed)
Pt. Called , concerned he may have COVID 19. Given [facility]'s testing site information. No availability in the practice today for a virtual visit. Pt. Will decide where he will get tested.  Answer Assessment - Initial Assessment Questions 1. TEMPERATURE: "What is the most recent temperature?"  "How was it measured?"      100.6 2. ONSET: "When did the fever start?"      During the night 3. SYMPTOMS: "Do you have any other symptoms besides the fever?"  (e.g., colds, headache, sore throat, earache, cough, rash, diarrhea, vomiting, abdominal pain)     Diarrhea 4. CAUSE: If there are no symptoms, ask: "What do you think is causing the fever?"      Unsure 5. CONTACTS: "Does anyone else in the family have an infection?"     No 6. TREATMENT: "What have you done so far to treat this fever?" (e.g., medications)     Tylenol 7. IMMUNOCOMPROMISE: "Do you have of the following: diabetes, HIV positive, splenectomy, cancer chemotherapy, chronic steroid treatment, transplant patient, etc."     No 8. PREGNANCY: "Is there any chance you are pregnant?" "When was your last menstrual period?"     n/a 9. TRAVEL: "Have you traveled out of the country in the last month?" (e.g., travel history, exposures)     No  Protocols used: FEVER-A-AH

## 2019-12-27 ENCOUNTER — Ambulatory Visit: Payer: Medicare Other

## 2020-01-03 ENCOUNTER — Ambulatory Visit: Payer: Medicare Other | Attending: Internal Medicine

## 2020-01-03 DIAGNOSIS — Z23 Encounter for immunization: Secondary | ICD-10-CM

## 2020-01-03 NOTE — Progress Notes (Signed)
   Covid-19 Vaccination Clinic  Name:  Marc Jackson    MRN: [id number] DOB: 1946/02/23  01/03/2020  Mr. Monigold was observed post Covid-19 immunization for 15 minutes without incident. He was provided with Vaccine Information Sheet and instruction to access the V-Safe system.   Mr. Mcrill was instructed to call 911 with any severe reactions post vaccine: Marland Kitchen Difficulty breathing  . Swelling of face and throat  . A fast heartbeat  . A bad rash all over body  . Dizziness and weakness   Immunizations Administered    Name Date Dose VIS Date Route   Pfizer COVID-19 Vaccine 01/03/2020 10:59 AM 0.3 mL 09/23/2019 Intramuscular   Manufacturer: [employer]   Lot: [id number]   [place]: [id number]

## 2020-02-08 ENCOUNTER — Other Ambulatory Visit: Admission: RE | Admit: 2020-02-08 | Payer: Medicare Other | Source: Ambulatory Visit

## 2020-02-10 ENCOUNTER — Ambulatory Visit: Payer: Medicare Other | Admitting: Anesthesiology

## 2020-02-10 ENCOUNTER — Ambulatory Visit
Admission: RE | Admit: 2020-02-10 | Discharge: 2020-02-10 | Disposition: A | Discharge status: Home / Self Care | Payer: Medicare Other | Attending: Gastroenterology | Admitting: Gastroenterology

## 2020-02-10 ENCOUNTER — Encounter
Admission: RE | Disposition: A | Discharge status: Home / Self Care | Payer: Self-pay | Source: Home / Self Care | Attending: Gastroenterology

## 2020-02-10 DIAGNOSIS — Z880 Allergy status to penicillin: Secondary | ICD-10-CM | POA: Insufficient documentation

## 2020-02-10 DIAGNOSIS — E785 Hyperlipidemia, unspecified: Secondary | ICD-10-CM | POA: Diagnosis not present

## 2020-02-10 DIAGNOSIS — Z79899 Other long term (current) drug therapy: Secondary | ICD-10-CM | POA: Diagnosis not present

## 2020-02-10 DIAGNOSIS — Z1211 Encounter for screening for malignant neoplasm of colon: Secondary | ICD-10-CM | POA: Diagnosis present

## 2020-02-10 DIAGNOSIS — E039 Hypothyroidism, unspecified: Secondary | ICD-10-CM | POA: Insufficient documentation

## 2020-02-10 DIAGNOSIS — Z7982 Long term (current) use of aspirin: Secondary | ICD-10-CM | POA: Insufficient documentation

## 2020-02-10 DIAGNOSIS — Z87891 Personal history of nicotine dependence: Secondary | ICD-10-CM | POA: Insufficient documentation

## 2020-02-10 DIAGNOSIS — Z79890 Hormone replacement therapy (postmenopausal): Secondary | ICD-10-CM | POA: Diagnosis not present

## 2020-02-10 HISTORY — PX: COLONOSCOPY WITH PROPOFOL: SHX5780

## 2020-02-10 SURGERY — COLONOSCOPY WITH PROPOFOL
Anesthesia: General

## 2020-02-10 MED ORDER — LIDOCAINE HCL (PF) 2 % IJ SOLN
INTRAMUSCULAR | Status: AC
  Filled 2020-02-10: qty 10

## 2020-02-10 MED ORDER — PROPOFOL 10 MG/ML IV BOLUS
INTRAVENOUS | Status: DC | PRN
  Administered 2020-02-10: 25 mg via INTRAVENOUS
  Administered 2020-02-10: 75 mg via INTRAVENOUS

## 2020-02-10 MED ORDER — SODIUM CHLORIDE 0.9 % IV SOLN
INTRAVENOUS | Status: DC
  Administered 2020-02-10: 09:00:00 1000 mL via INTRAVENOUS

## 2020-02-10 MED ORDER — PROPOFOL 500 MG/50ML IV EMUL
INTRAVENOUS | Status: DC | PRN
  Administered 2020-02-10: 120 ug/kg/min via INTRAVENOUS

## 2020-02-10 MED ORDER — LIDOCAINE HCL (CARDIAC) PF 100 MG/5ML IV SOSY
PREFILLED_SYRINGE | INTRAVENOUS | Status: DC | PRN
  Administered 2020-02-10: 25 mg via INTRAVENOUS

## 2020-02-10 MED ORDER — PROPOFOL 500 MG/50ML IV EMUL
INTRAVENOUS | Status: AC
  Filled 2020-02-10: qty 50

## 2020-02-10 NOTE — Anesthesia Postprocedure Evaluation (Signed)
Anesthesia Post Note  Patient: Marc Jackson  Procedure(s) Performed: COLONOSCOPY WITH PROPOFOL (N/A )  Patient location during evaluation: Endoscopy Anesthesia Type: General Level of consciousness: awake and alert Pain management: pain level controlled Vital Signs Assessment: post-procedure vital signs reviewed and stable Respiratory status: spontaneous breathing and respiratory function stable Cardiovascular status: stable Anesthetic complications: no     Last Vitals:  Vitals:   02/10/20 1108 02/10/20 1118  BP: 114/64 121/70  Pulse: (!) 57 (!) 49  Resp: 19 15  Temp:    SpO2: 99% 100%    Last Pain:  Vitals:   02/10/20 1118  TempSrc:   PainSc: 0-No pain                 Elza Varricchio K

## 2020-02-10 NOTE — Anesthesia Preprocedure Evaluation (Signed)
Anesthesia Evaluation  Patient identified by MRN, date of birth, ID band Patient awake    Reviewed: Allergy & Precautions, NPO status , Patient's Chart, lab work & pertinent test results  History of Anesthesia Complications Negative for: history of anesthetic complications  Airway Mallampati: I       Dental   Pulmonary neg sleep apnea, neg COPD, Not current smoker, former smoker,           Cardiovascular (-) hypertension(-) Past MI and (-) CHF (-) dysrhythmias (-) Valvular Problems/Murmurs     Neuro/Psych neg Seizures    GI/Hepatic Neg liver ROS, neg GERD  ,  Endo/Other  neg diabetesHypothyroidism   Renal/GU negative Renal ROS     Musculoskeletal   Abdominal   Peds  Hematology   Anesthesia Other Findings   Reproductive/Obstetrics                             Anesthesia Physical Anesthesia Plan  ASA: II  Anesthesia Plan: General   Post-op Pain Management:    Induction: Intravenous  PONV Risk Score and Plan: 2 and Propofol infusion and TIVA  Airway Management Planned: Nasal Cannula  Additional Equipment:   Intra-op Plan:   Post-operative Plan:   Informed Consent: I have reviewed the patients History and Physical, chart, labs and discussed the procedure including the risks, benefits and alternatives for the proposed anesthesia with the patient or authorized representative who has indicated his/her understanding and acceptance.       Plan Discussed with:   Anesthesia Plan Comments:         Anesthesia Quick Evaluation

## 2020-02-10 NOTE — Op Note (Signed)
[facility] Gastroenterology Patient Name: Marc Jackson Procedure Date: 02/10/2020 10:03 AM MRN: [id number] Account #: [id number] Date of Birth: 06-07-1946 Admit Type: Outpatient Age: 74 Room: [facility] ENDO ROOM 3 Gender: Male Note Status: Finalized Procedure:             Colonoscopy Indications:           Screening for colorectal malignant neoplasm Providers:             [name] MD, MD Medicines:             Monitored Anesthesia Care Complications:         No immediate complications. Procedure:             Pre-Anesthesia Assessment:                        - Prior to the procedure, a History and Physical was                         performed, and patient medications, allergies and                         sensitivities were reviewed. The patient's tolerance                         of previous anesthesia was reviewed.                        - The risks and benefits of the procedure and the                         sedation options and risks were discussed with the                         patient. All questions were answered and informed                         consent was obtained.                        - ASA Grade Assessment: II - A patient with mild                         systemic disease.                        After obtaining informed consent, the colonoscope was                         passed under direct vision. Throughout the procedure,                         the patient's blood pressure, pulse, and oxygen                         saturations were monitored continuously. The                         Colonoscope was introduced through the anus and  advanced to the the cecum, identified by the                         appendiceal orifice. The colonoscopy was performed                         with ease. The patient tolerated the procedure well.                         The quality of the bowel preparation was excellent. Findings:      The  perianal and digital rectal examinations were normal.      The entire examined colon appeared normal on direct and retroflexion       views. Impression:            - The entire examined colon is normal on direct and                         retroflexion views.                        - No specimens collected. Recommendation:        - Discharge patient to home (with escort).                        - Resume previous diet.                        - Continue present medications.                        - Repeat colonoscopy in 10 years for screening                         purposes. Procedure Code(s):     --- Professional ---                        [phone], Colonoscopy, flexible; diagnostic, including                         collection of specimen(s) by brushing or washing, when                         performed (separate procedure) Diagnosis Code(s):     --- Professional ---                        Z12.11, Encounter for screening for malignant neoplasm                         of colon CPT copyright 2019 American Medical Association. All rights reserved. The codes documented in this report are preliminary and upon coder review may  be revised to meet current compliance requirements. [name], MD [name] MD, MD 02/10/2020 10:46:47 AM This report has been signed electronically. Number of Addenda: 0 Note Initiated On: 02/10/2020 10:03 AM Scope Withdrawal Time: 0 hours 16 minutes 11 seconds  Total Procedure Duration: 0 hours 21 minutes 49 seconds  Estimated Blood Loss:  Estimated blood loss: none.      [facility]

## 2020-02-10 NOTE — H&P (Signed)
[name], MD [address], [place], [place], [place], [address] [address], [place], [place], [place], [address] Phone: [phone]  Fax: [phone]  Primary Care Physician:  Delsa Grana, PA-C   Pre-Procedure History & Physical: HPI:  Marc Jackson is a 74 y.o. male is here for an colonoscopy.   Past Medical History:  Diagnosis Date  . Cancer ([place])    SKIN  . Cataract   . Hyperlipidemia   . Hypothyroidism   . IBS (irritable bowel syndrome)   . Thyroid disease     Past Surgical History:  Procedure Laterality Date  . BACK SURGERY    . CATARACT EXTRACTION W/PHACO Right 11/24/2017   Procedure: CATARACT EXTRACTION PHACO AND INTRAOCULAR LENS PLACEMENT (IOC);  Surgeon: Birder Robson, MD;  Location: [facility] ORS;  Service: Ophthalmology;  Laterality: Right;  [place] 00:28.3 AP% 14.6 CDE 4.12 Fluid pack Lot # [id number]  . CATARACT EXTRACTION W/PHACO Left 12/29/2017   Procedure: CATARACT EXTRACTION PHACO AND INTRAOCULAR LENS PLACEMENT (IOC);  Surgeon: Birder Robson, MD;  Location: [facility] ORS;  Service: Ophthalmology;  Laterality: Left;  [place] 00:30.4 AP% 14.0 CDE 4.25 Fluid Pack Lot # [id number]  . CO2 LASER OF [place]    . discetomy    . HERNIA REPAIR     X 2  . SKIN CANCER DESTRUCTION      Prior to Admission medications   Medication Sig Start Date End Date Taking? Authorizing Provider  aspirin EC 81 MG tablet Take 81 mg by mouth 4 (four) times a week.   Yes [provider]  atorvastatin (LIPITOR) 20 MG tablet Take 1 tablet (20 mg total) by mouth at bedtime. 11/21/19  Yes Delsa Grana, PA-C  levothyroxine (SYNTHROID) 125 MCG tablet Take 1 tablet (125 mcg total) by mouth daily. Prior to breakfast 11/21/19  Yes Delsa Grana, PA-C    Allergies as of 12/13/2019 - Review Complete 11/17/2019  Allergen Reaction Noted  . Tetanus toxoids Swelling 05/03/2015  . Penicillins Swelling and Rash 05/03/2015    Family History  Problem Relation Age of Onset  . Cancer  Father        prostate  . Heart disease Father   . Hypothyroidism Sister   . Heart disease Mother        CHF, atrial fibrillation  . Hyperlipidemia Mother        high TG  . Diabetes Brother   . Heart disease Brother   . Obesity Brother     Social History   Socioeconomic History  . Marital status: Married    Spouse name: Not on file  . Number of children: 0  . Years of education: Not on file  . Highest education level: Master's degree (e.g., MA, MS, MEng, MEd, MSW, MBA)  Occupational History  . Not on file  Tobacco Use  . Smoking status: Former Research scientist (life sciences)  . Smokeless tobacco: Never Used  . Tobacco comment: Quit 1985  Substance and Sexual Activity  . Alcohol use: Yes    Alcohol/week: 28.0 standard drinks    Types: 28 Glasses of wine per week  . Drug use: No  . Sexual activity: Yes    Partners: Female  Other Topics Concern  . 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  Difficulty of Paying Living Expenses: Not hard at all  Food Insecurity: No Food Insecurity  . Worried About Charity fundraiser in the Last Year: Never true  . Ran Out of Food in the Last Year: Never true  Transportation Needs: No Transportation Needs  . Lack of Transportation (Medical): No  . Lack of Transportation (Non-Medical): No  Physical Activity: Insufficiently Active  . Days of Exercise per Week: 4 days  . Minutes of Exercise per Session: 30 min  Stress: No Stress Concern Present  . Feeling of Stress : Only a little  Social Connections: Slightly Isolated  . Frequency of Communication with Friends and Family: More than three times a week  . Frequency of Social Gatherings with Friends and Family: Once a week  . Attends Religious Services: More than 4 times per year  . Active  Member of Clubs or Organizations: No  . Attends Archivist Meetings: Never  . Marital Status: Married  Human resources officer Violence: Not At Risk  . Fear of Current or Ex-Partner: No  . Emotionally Abused: No  . Physically Abused: No  . Sexually Abused: No    Review of Systems: See HPI, otherwise negative ROS  Physical Exam: BP 111/75   Pulse (!) 59   Temp (!) 97.3 F (36.3 C) (Temporal)   Resp 20   Ht 6\' 1"  (1.854 m)   Wt 85.8 kg   SpO2 99%   BMI 24.96 kg/m  General:   Alert,  pleasant and cooperative in NAD Head:  Normocephalic and atraumatic. Neck:  Supple; no masses or thyromegaly. Lungs:  Clear throughout to auscultation, normal respiratory effort.    Heart:  +S1, +S2, Regular rate and rhythm, No edema. Abdomen:  Soft, nontender and nondistended. Normal bowel sounds, without guarding, and without rebound.   Neurologic:  Alert and  oriented x4;  grossly normal neurologically.  Impression/Plan: Marc Jackson is here for an colonoscopy to be performed for Screening colonoscopy average risk   Risks, benefits, limitations, and alternatives regarding  colonoscopy have been reviewed with the patient.  Questions have been answered.  All parties agreeable.   [name], MD  02/10/2020, 10:03 AM

## 2020-02-10 NOTE — Transfer of Care (Signed)
Immediate Anesthesia Transfer of Care Note  Patient: Marc Jackson  Procedure(s) Performed: COLONOSCOPY WITH PROPOFOL (N/A )  Patient Location: PACU and Endoscopy Unit  Anesthesia Type:General  Level of Consciousness: awake  Airway & Oxygen Therapy: Patient Spontanous Breathing  Post-op Assessment: Report given to RN  Post vital signs: stable  Last Vitals:  Vitals Value Taken Time  BP    Temp    Pulse 48 02/10/20 1048  Resp 16 02/10/20 1048  SpO2 97 % 02/10/20 1048  Vitals shown include unvalidated device data.  Last Pain:  Vitals:   02/10/20 0836  TempSrc: Temporal  PainSc: 0-No pain         Complications: No apparent anesthesia complications

## 2020-02-13 ENCOUNTER — Encounter: Payer: Self-pay | Admitting: *Deleted

## 2020-08-16 ENCOUNTER — Ambulatory Visit (INDEPENDENT_AMBULATORY_CARE_PROVIDER_SITE_OTHER): Payer: Medicare Other | Admitting: Emergency Medicine

## 2020-08-16 ENCOUNTER — Ambulatory Visit: Payer: Medicare Other

## 2020-08-16 ENCOUNTER — Other Ambulatory Visit: Payer: Self-pay

## 2020-08-16 DIAGNOSIS — Z23 Encounter for immunization: Secondary | ICD-10-CM | POA: Diagnosis not present

## 2020-11-20 ENCOUNTER — Ambulatory Visit (INDEPENDENT_AMBULATORY_CARE_PROVIDER_SITE_OTHER): Payer: Medicare Other | Admitting: Family Medicine

## 2020-11-20 ENCOUNTER — Encounter: Payer: Self-pay | Admitting: Family Medicine

## 2020-11-20 ENCOUNTER — Ambulatory Visit (INDEPENDENT_AMBULATORY_CARE_PROVIDER_SITE_OTHER): Payer: Medicare Other

## 2020-11-20 ENCOUNTER — Other Ambulatory Visit: Payer: Self-pay

## 2020-11-20 VITALS — BP 116/72 | HR 59 | Temp 98.0°F | Resp 16 | Ht 73.0 in | Wt 172.5 lb

## 2020-11-20 DIAGNOSIS — E034 Atrophy of thyroid (acquired): Secondary | ICD-10-CM | POA: Diagnosis not present

## 2020-11-20 DIAGNOSIS — R7989 Other specified abnormal findings of blood chemistry: Secondary | ICD-10-CM

## 2020-11-20 DIAGNOSIS — R7303 Prediabetes: Secondary | ICD-10-CM | POA: Diagnosis not present

## 2020-11-20 DIAGNOSIS — E782 Mixed hyperlipidemia: Secondary | ICD-10-CM | POA: Diagnosis not present

## 2020-11-20 DIAGNOSIS — Z5181 Encounter for therapeutic drug level monitoring: Secondary | ICD-10-CM

## 2020-11-20 DIAGNOSIS — Z0000 Encounter for general adult medical examination without abnormal findings: Secondary | ICD-10-CM | POA: Diagnosis not present

## 2020-11-20 DIAGNOSIS — Z125 Encounter for screening for malignant neoplasm of prostate: Secondary | ICD-10-CM

## 2020-11-20 DIAGNOSIS — Z8042 Family history of malignant neoplasm of prostate: Secondary | ICD-10-CM

## 2020-11-20 DIAGNOSIS — E039 Hypothyroidism, unspecified: Secondary | ICD-10-CM

## 2020-11-20 MED ORDER — ATORVASTATIN CALCIUM 20 MG PO TABS: 20.0000 mg | ORAL_TABLET | Freq: Every day | ORAL | 3 refills | Status: DC

## 2020-11-20 NOTE — Progress Notes (Signed)
Subjective:   Marc Jackson is a 75 y.o. male who presents for Medicare Annual/Subsequent preventive examination.  Virtual Visit via Video Note  I connected with  Marc Jackson on 11/20/20 at  8:40 AM EST via telehealth video enabled device and verified that I am speaking with the correct person using two identifiers.  Location: Patient: home Provider: [place] Persons participating in the virtual visit: [place]   I discussed the limitations, risks, security and privacy concerns of performing an evaluation and management service by telephone and the availability of in person appointments. The patient expressed understanding and agreed to proceed.  Some vital signs may be absent or patient reported.   Clemetine Marker, LPN    Review of Systems     Cardiac Risk Factors include: advanced age (>13men, >5 women);dyslipidemia;male gender     Objective:    There were no vitals filed for this visit. There is no height or weight on file to calculate BMI.  Advanced Directives 11/20/2020 02/10/2020 11/17/2019 11/12/2018 10/06/2018 11/24/2017 11/11/2016  Does Patient Have a Medical Advance Directive? Yes Yes Yes Yes No Yes Yes  Type of Paramedic of [place];Living will Living will [place];Living will [place];Living will - [place];Living will [place];Living will  Copy of [place] in Chart? No - copy requested - No - copy requested No - copy requested - No - copy requested -    Current Medications (verified) Outpatient Encounter Medications as of 11/20/2020  Medication Sig  . aspirin EC 81 MG tablet Take 81 mg by mouth 4 (four) times a week.  [place] Kitchen atorvastatin (LIPITOR) 20 MG tablet Take 1 tablet (20 mg total) by mouth at bedtime.  [place] Kitchen levothyroxine (SYNTHROID) 125 MCG tablet Take 1 tablet (125 mcg total) by mouth daily. Prior to breakfast   No  facility-administered encounter medications on file as of 11/20/2020.    Allergies (verified) Tetanus toxoids and Penicillins   History: Past Medical History:  Diagnosis Date  . Cancer ([place])    SKIN  . Cataract   . Hyperlipidemia   . Hypothyroidism   . IBS (irritable bowel syndrome)   . Thyroid disease    Past Surgical History:  Procedure Laterality Date  . BACK SURGERY    . CATARACT EXTRACTION W/PHACO Right 11/24/2017   Procedure: CATARACT EXTRACTION PHACO AND INTRAOCULAR LENS PLACEMENT (IOC);  Surgeon: Birder Robson, MD;  Location: [facility] ORS;  Service: Ophthalmology;  Laterality: Right;  [place] 00:28.3 AP% 14.6 CDE 4.12 Fluid pack Lot # [id number]  . CATARACT EXTRACTION W/PHACO Left 12/29/2017   Procedure: CATARACT EXTRACTION PHACO AND INTRAOCULAR LENS PLACEMENT (IOC);  Surgeon: Birder Robson, MD;  Location: [facility] ORS;  Service: Ophthalmology;  Laterality: Left;  [place] 00:30.4 AP% 14.0 CDE 4.25 Fluid Pack Lot # [id number]  . CO2 LASER OF [place]    . COLONOSCOPY WITH PROPOFOL N/A 02/10/2020   Procedure: COLONOSCOPY WITH PROPOFOL;  Surgeon: [name], MD;  Location: [facility] ENDOSCOPY;  Service: Gastroenterology;  Laterality: N/A;  COVID POSITIVE ON December 24, 2019  . discetomy    . EYE SURGERY     Cataracts; laser procedure for torn retina  . HERNIA REPAIR     X 2  . SKIN CANCER DESTRUCTION    . SPINE SURGERY     Partial diskectomy, laminectomy L4-L5   Family History  Problem Relation Age of Onset  . Cancer Father  prostate  . Heart disease Father   . Hypothyroidism Sister   . Heart disease Mother        CHF, atrial fibrillation  . Hyperlipidemia Mother        high TG  . Diabetes Brother   . Heart disease Brother   . Obesity Brother    Social History   Socioeconomic History  . Marital status: Married    Spouse name: Not on file  . Number of children: 0  . Years of education: Not on file  . Highest education level: Master's degree (e.g., MA, MS, MEng, MEd,  MSW, MBA)  Occupational History  . Not on file  Tobacco Use  . Smoking status: Former Research scientist (life sciences)  . Smokeless tobacco: Never Used  . Tobacco comment: Quit 1985  Vaping Use  . Vaping Use: Never used  Substance and Sexual Activity  . Alcohol use: Yes    Alcohol/week: 28.0 standard drinks    Types: 28 Glasses of wine per week  . Drug use: No  . Sexual activity: Yes    Partners: Female  Other Topics Concern  . 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 Difficulty of Paying Living Expenses: Not hard at all  Food Insecurity: No Food Insecurity  . Worried About Charity fundraiser in the Last Year: Never true  . Ran Out of Food in the Last Year: Never true  Transportation Needs: No Transportation Needs  . Lack of Transportation (Medical): No  . Lack of Transportation (Non-Medical): No  Physical Activity: Sufficiently Active  . Days of Exercise per Week: 5 days  . Minutes of Exercise per Session: 40 min  Stress: No Stress Concern Present  . Feeling of Stress : Not at all  Social Connections: Moderately Integrated  . Frequency of Communication with Friends and Family: More than three times a week  . Frequency of Social Gatherings with Friends and Family: Once a week  . Attends Religious Services: More than 4 times per year  . Active Member of Clubs or Organizations: No  . Attends Archivist Meetings: Never  . Marital Status: Married    Tobacco Counseling Counseling given: Not Answered Comment: Quit 1985   Clinical Intake:  Pre-visit preparation completed: No  Pain : No/denies pain     Nutritional Risks: None Diabetes: No  How often do you need to have someone help you when you read instructions, pamphlets, or other written materials from your  doctor or pharmacy?: 1 - Never    Interpreter Needed?: No  Information entered by :: Clemetine Marker LPN   Activities of Daily Living In your present state of health, do you have any difficulty performing the following activities: 11/20/2020  Hearing? N  Comment declines hearing aids  Vision? N  Difficulty concentrating or making decisions? N  Walking or climbing stairs? N  Dressing or bathing? N  Doing errands, shopping? N  Preparing Food and eating ? N  Using the Toilet? N  In the past six months, have you accidently leaked urine? N  Do you have problems with loss of bowel control? N  Managing your Medications? N  Managing your Finances? N  Housekeeping or managing your Housekeeping? N  Some recent data might be hidden  Patient Care Team: Delsa Grana, PA-C as PCP - General (Family Medicine) Dasher, Rayvon Char, MD (Dermatology)  Indicate any recent Medical Services you may have received from other than [facility] providers in the past year (date may be approximate).     Assessment:   This is a routine wellness examination for [place].  Hearing/Vision screen  Hearing Screening   125Hz  250Hz  500Hz  1000Hz  2000Hz  3000Hz  4000Hz  6000Hz  8000Hz   Right ear:           Left ear:           Comments: Pt denies hearing difficulty  Vision Screening Comments: Annual vision screenings done [facility] Dr. George Ina  Dietary issues and exercise activities discussed: Current Exercise Habits: Home exercise routine, Type of exercise: Other - see comments (cycling), Time (Minutes): 40, Frequency (Times/Week): 5, Weekly Exercise (Minutes/Week): 200, Intensity: Moderate, Exercise limited by: None identified  Goals    . Increase physical activity     Recommend exercising 150 minutes per week    . Weight (lb) < 189 lb 6.4 oz (85.9 kg)      Depression Screen PHQ 2/9 Scores 11/20/2020 11/17/2019 11/17/2019 11/12/2018 11/10/2017 11/11/2016 11/09/2015  PHQ - 2 Score 0 0 0 0 0 0 0  PHQ- 9 Score - -  0 - - - -    Fall Risk Fall Risk  11/20/2020 11/17/2019 11/17/2019 11/12/2018 11/10/2017  Falls in the past year? 0 0 0 0 No  Number falls in past yr: 0 0 0 0 -  Injury with Fall? 0 0 0 0 -  Risk for fall due to : No Fall Risks No Fall Risks - - -  Follow up - Falls prevention discussed - Falls prevention discussed -    FALL RISK PREVENTION PERTAINING TO THE HOME:  Any stairs in or around the home? Yes  If so, are there any without handrails? No  Home free of loose throw rugs in walkways, pet beds, electrical cords, etc? Yes  Adequate lighting in your home to reduce risk of falls? Yes   ASSISTIVE DEVICES UTILIZED TO PREVENT FALLS:  Life alert? No  Use of a cane, walker or w/c? No  Grab bars in the bathroom? No  Shower chair or bench in shower? No  Elevated toilet seat or a handicapped toilet? No   TIMED UP AND GO:  Was the test performed? No . Telephonic visit.   Cognitive Function: Normal cognitive status assessed by direct observation by this Nurse Health Advisor. No abnormalities found.       6CIT Screen 11/12/2018 11/10/2017  What Year? 0 points 0 points  What month? 0 points 0 points  What time? 0 points 0 points  Count back from 20 0 points 0 points  Months in reverse 0 points 0 points  Repeat phrase 0 points 0 points  Total Score 0 0    Immunizations Immunization History  Administered Date(s) Administered  . Fluad Quad(high Dose 65+) 07/19/2019, 08/16/2020  . Influenza, High Dose Seasonal PF 08/24/2018  . Influenza-Unspecified 08/01/2015, 07/30/2016, 07/30/2017  . PFIZER(Purple Top)SARS-COV-2 Vaccination 12/03/2019, 01/03/2020, 07/10/2020  . Pneumococcal Conjugate-13 09/14/2014  . Pneumococcal-Unspecified 08/08/2013  . Zoster 07/13/2010  . Zoster Recombinat (Shingrix) 05/16/2018, 09/04/2018   Tdap status: n/a due to allergy  Flu Vaccine status: Up to date  Pneumococcal vaccine status: Up to date  Covid-19 vaccine status: Completed vaccines  Qualifies for  Shingles Vaccine? Yes   Zostavax completed Yes   Shingrix Completed?: Yes  Screening  Tests Health Maintenance  Topic Date Due  . COLONOSCOPY (Pts 45-59yrs Insurance coverage will need to be confirmed)  02/09/2030  . INFLUENZA VACCINE  Completed  . COVID-19 Vaccine  Completed  . Hepatitis C Screening  Completed  . PNA vac Low Risk Adult  Completed  . TETANUS/TDAP  Discontinued    Health Maintenance  There are no preventive care reminders to display for this patient.  Colorectal cancer screening: Type of screening: Colonoscopy. Completed 02/10/20. Repeat every 10 years  Lung Cancer Screening: (Low Dose CT Chest recommended if Age 18-80 years, 30 pack-year currently smoking OR have quit w/in 15years.) does not qualify.   Additional Screening:  Hepatitis C Screening: does qualify; Completed 11/09/15  Vision Screening: Recommended annual ophthalmology exams for early detection of glaucoma and other disorders of the eye. Is the patient up to date with their annual eye exam?  Yes  Who is the provider or what is the name of the office in which the patient attends annual eye exams? [place] Screening: Recommended annual dental exams for proper oral hygiene  Community Resource Referral / Chronic Care Management: CRR required this visit?  No   CCM required this visit?  No      Plan:     I have personally reviewed and noted the following in the patient's chart:   . Medical and social history . Use of alcohol, tobacco or illicit drugs  . Current medications and supplements . Functional ability and status . Nutritional status . Physical activity . Advanced directives . List of other physicians . Hospitalizations, surgeries, and ER visits in previous 12 months . Vitals . Screenings to include cognitive, depression, and falls . Referrals and appointments  In addition, I have reviewed and discussed with patient certain preventive protocols, quality metrics,  and best practice recommendations. A written personalized care plan for preventive services as well as general preventive health recommendations were provided to patient.     Clemetine Marker, LPN   02/14/5624   Nurse Notes: pt doing well and appreciative of visit today. Pt has same day appt with Delsa Grana PAC in office. Due for labs.

## 2020-11-20 NOTE — Patient Instructions (Signed)
[name] , Thank you for taking time to come for your Medicare Wellness Visit. I appreciate your ongoing commitment to your health goals. Please review the following plan we discussed and let me know if I can assist you in the future.   Screening recommendations/referrals: Colonoscopy: done 02/10/20 Recommended yearly ophthalmology/optometry visit for glaucoma screening and checkup Recommended yearly dental visit for hygiene and checkup  Vaccinations: Influenza vaccine: done 08/16/20 Pneumococcal vaccine: done 09/14/14 Tdap vaccine: n/a Shingles vaccine: done 05/16/18 & 09/04/18   Covid-19:  Done 12/03/19, 01/03/20 & 07/10/20  Advanced directives: Please bring a copy of your health care power of attorney and living will to the office at your convenience.  Conditions/risks identified: Keep up the great work!  Next appointment: Follow up in one year for your annual wellness visit.   Preventive Care 74 Years and Older, Male Preventive care refers to lifestyle choices and visits with your health care provider that can promote health and wellness. What does preventive care include?  A yearly physical exam. This is also called an annual well check.  Dental exams once or twice a year.  Routine eye exams. Ask your health care provider how often you should have your eyes checked.  Personal lifestyle choices, including:  Daily care of your teeth and gums.  Regular physical activity.  Eating a healthy diet.  Avoiding tobacco and drug use.  Limiting alcohol use.  Practicing safe sex.  Taking low doses of aspirin every day.  Taking vitamin and mineral supplements as recommended by your health care provider. What happens during an annual well check? The services and screenings done by your health care provider during your annual well check will depend on your age, overall health, lifestyle risk factors, and family history of disease. Counseling  Your health care provider may ask you  questions about your:  Alcohol use.  Tobacco use.  Drug use.  Emotional well-being.  Home and relationship well-being.  Sexual activity.  Eating habits.  History of falls.  Memory and ability to understand (cognition).  Work and work Statistician. Screening  You may have the following tests or measurements:  Height, weight, and BMI.  Blood pressure.  Lipid and cholesterol levels. These may be checked every 5 years, or more frequently if you are over 58 years old.  Skin check.  Lung cancer screening. You may have this screening every year starting at age 28 if you have a 30-pack-year history of smoking and currently smoke or have quit within the past 15 years.  Fecal occult blood test (FOBT) of the stool. You may have this test every year starting at age 65.  Flexible sigmoidoscopy or colonoscopy. You may have a sigmoidoscopy every 5 years or a colonoscopy every 10 years starting at age 69.  Prostate cancer screening. Recommendations will vary depending on your family history and other risks.  Hepatitis C blood test.  Hepatitis B blood test.  Sexually transmitted disease (STD) testing.  Diabetes screening. This is done by checking your blood sugar (glucose) after you have not eaten for a while (fasting). You may have this done every 1-3 years.  Abdominal aortic aneurysm (AAA) screening. You may need this if you are a current or former smoker.  Osteoporosis. You may be screened starting at age 20 if you are at high risk. Talk with your health care provider about your test results, treatment options, and if necessary, the need for more tests. Vaccines  Your health care provider may recommend certain vaccines,  such as:  Influenza vaccine. This is recommended every year.  Tetanus, diphtheria, and acellular pertussis (Tdap, Td) vaccine. You may need a Td booster every 10 years.  Zoster vaccine. You may need this after age 33.  Pneumococcal 13-valent conjugate  (PCV13) vaccine. One dose is recommended after age 21.  Pneumococcal polysaccharide (PPSV23) vaccine. One dose is recommended after age 107. Talk to your health care provider about which screenings and vaccines you need and how often you need them. This information is not intended to replace advice given to you by your health care provider. Make sure you discuss any questions you have with your health care provider. Document Released: 10/26/2015 Document Revised: 06/18/2016 Document Reviewed: 07/31/2015 Elsevier Interactive Patient Education  2017 [place] Prevention in the Home Falls can cause injuries. They can happen to people of all ages. There are many things you can do to make your home safe and to help prevent falls. What can I do on the outside of my home?  Regularly fix the edges of walkways and driveways and fix any cracks.  Remove anything that might make you trip as you walk through a door, such as a raised step or threshold.  Trim any bushes or trees on the path to your home.  Use bright outdoor lighting.  Clear any walking paths of anything that might make someone trip, such as rocks or tools.  Regularly check to see if handrails are loose or broken. Make sure that both sides of any steps have handrails.  Any raised decks and porches should have guardrails on the edges.  Have any leaves, snow, or ice cleared regularly.  Use sand or salt on walking paths during winter.  Clean up any spills in your garage right away. This includes oil or grease spills. What can I do in the bathroom?  Use night lights.  Install grab bars by the toilet and in the tub and shower. Do not use towel bars as grab bars.  Use non-skid mats or decals in the tub or shower.  If you need to sit down in the shower, use a plastic, non-slip stool.  Keep the floor dry. Clean up any water that spills on the floor as soon as it happens.  Remove soap buildup in the tub or shower  regularly.  Attach bath mats securely with double-sided non-slip rug tape.  Do not have throw rugs and other things on the floor that can make you trip. What can I do in the bedroom?  Use night lights.  Make sure that you have a light by your bed that is easy to reach.  Do not use any sheets or blankets that are too big for your bed. They should not hang down onto the floor.  Have a firm chair that has side arms. You can use this for support while you get dressed.  Do not have throw rugs and other things on the floor that can make you trip. What can I do in the kitchen?  Clean up any spills right away.  Avoid walking on wet floors.  Keep items that you use a lot in easy-to-reach places.  If you need to reach something above you, use a strong step stool that has a grab bar.  Keep electrical cords out of the way.  Do not use floor polish or wax that makes floors slippery. If you must use wax, use non-skid floor wax.  Do not have throw rugs and other things on  the floor that can make you trip. What can I do with my stairs?  Do not leave any items on the stairs.  Make sure that there are handrails on both sides of the stairs and use them. Fix handrails that are broken or loose. Make sure that handrails are as long as the stairways.  Check any carpeting to make sure that it is firmly attached to the stairs. Fix any carpet that is loose or worn.  Avoid having throw rugs at the top or bottom of the stairs. If you do have throw rugs, attach them to the floor with carpet tape.  Make sure that you have a light switch at the top of the stairs and the bottom of the stairs. If you do not have them, ask someone to add them for you. What else can I do to help prevent falls?  Wear shoes that:  Do not have high heels.  Have rubber bottoms.  Are comfortable and fit you well.  Are closed at the toe. Do not wear sandals.  If you use a stepladder:  Make sure that it is fully  opened. Do not climb a closed stepladder.  Make sure that both sides of the stepladder are locked into place.  Ask someone to hold it for you, if possible.  Clearly mark and make sure that you can see:  Any grab bars or handrails.  First and last steps.  Where the edge of each step is.  Use tools that help you move around (mobility aids) if they are needed. These include:  Canes.  Walkers.  Scooters.  Crutches.  Turn on the lights when you go into a dark area. Replace any light bulbs as soon as they burn out.  Set up your furniture so you have a clear path. Avoid moving your furniture around.  If any of your floors are uneven, fix them.  If there are any pets around you, be aware of where they are.  Review your medicines with your doctor. Some medicines can make you feel dizzy. This can increase your chance of falling. Ask your doctor what other things that you can do to help prevent falls. This information is not intended to replace advice given to you by your health care provider. Make sure you discuss any questions you have with your health care provider. Document Released: 07/26/2009 Document Revised: 03/06/2016 Document Reviewed: 11/03/2014 Elsevier Interactive Patient Education  2017 [employer].

## 2020-11-20 NOTE — Progress Notes (Signed)
Name: Marc Jackson   MRN: [id number]    DOB: 08/10/1946   Date:11/20/2020       Progress Note  Chief Complaint  Patient presents with  . Hyperlipidemia  . Hypothyroidism     Subjective:   [name] is a 75 y.o. male, presents to clinic for   Hypothyroidism:  Lost weight intentionally - dose changes over the alst 1-2 years Since last OV he lost weight,  Current Medication Regimen: 125 mcg synthroid Current Symptoms: denies fatigue, weight changes (other than intentional weight loss/lifestyle changes), heat/cold intolerance, bowel/skin changes or CVS symptoms Most recent results are below; we will be repeating labs today. Lab Results  Component Value Date   TSH 0.50 11/17/2019   Hyperlipidemia: Currently treated with lipitor 20 mg, pt reports good med compliance Last Lipids: Lab Results  Component Value Date   CHOL 127 11/17/2019   HDL 50 11/17/2019   LDLCALC 63 11/17/2019   TRIG 67 11/17/2019   CHOLHDL 2.5 11/17/2019   - Denies: Chest pain, shortness of breath, myalgias, claudication Overall improved weight, increase exercise, healthy diet   Today he is fasting for his labs   He would like to check his PSA again today with a family history of prostate cancer Discussion today on PSA labs, screening and interpretation reviewed Reviewed USPS TF recommendations and reviewed his past labs and IPSS/urinary symptoms Lab Results  Component Value Date   PSA1 0.7 11/09/2015   PSA 0.7 11/17/2019   PSA 0.7 05/17/2018   PSA 1.0 11/10/2017    IPSS    Row Name 11/20/20 1051         International Prostate Symptom Score   How often have you had the sensation of not emptying your bladder? Less than 1 in 5     How often have you had to urinate less than every two hours? Less than 1 in 5 times     How often have you found you stopped and started again several times when you urinated? Not at All     How often have you found it difficult to postpone urination? Less than 1  in 5 times     How often have you had a weak urinary stream? Less than half the time     How often have you had to strain to start urination? Not at All     How many times did you typically get up at night to urinate? None     Total IPSS Score 5           Quality of Life due to urinary symptoms   If you were to spend the rest of your life with your urinary condition just the way it is now how would you feel about that? Pleased           Weight loss-patient states that he has tried to maintain his weight around 170 but he does tend to have around 172 and he notes that his weight has been stable there for many months  Wt Readings from Last 5 Encounters:  11/20/20 172 lb 8 oz (78.2 kg)  02/10/20 189 lb 3.2 oz (85.8 kg)  11/17/19 209 lb 8 oz (95 kg)  11/17/19 209 lb 8 oz (95 kg)  11/12/18 203 lb 3.2 oz (92.2 kg)   BMI Readings from Last 5 Encounters:  11/20/20 22.76 kg/m  02/10/20 24.96 kg/m  11/17/19 27.64 kg/m  11/17/19 27.64 kg/m  11/12/18 27.18 kg/m  He denies  unintentional weight loss, palpitations, heat or cold intolerance, sweats, hair skin nail or bowel changes  He reports that he has a low resting heart rate in the 40s, he monitors it sometimes when at rest and with his watch and when exercising He denies any palpitations, near syncope, exertional symptoms, chest pain  He previously did a cardiac work-up and Holter monitor and with his monitoring now and back then he only has occasional skipped beats or premature beats about 3 times in minutes which he does not have any symptoms of  Health Maintenance  Topic Date Due  . COLONOSCOPY (Pts 45-51yrs Insurance coverage will need to be confirmed)  02/09/2030  . INFLUENZA VACCINE  Completed  . COVID-19 Vaccine  Completed  . Hepatitis C Screening  Completed  . PNA vac Low Risk Adult  Completed  . TETANUS/TDAP  Discontinued   And immunizations reviewed with pt today    Current Outpatient Medications:  .  aspirin EC 81  MG tablet, Take 81 mg by mouth 4 (four) times a week., Disp: , Rfl:  .  atorvastatin (LIPITOR) 20 MG tablet, Take 1 tablet (20 mg total) by mouth at bedtime., Disp: 90 tablet, Rfl: 3 .  levothyroxine (SYNTHROID) 125 MCG tablet, Take 1 tablet (125 mcg total) by mouth daily. Prior to breakfast, Disp: 90 tablet, Rfl: 3  Patient Active Problem List   Diagnosis Date Noted  . Prediabetes 11/17/2019  . Hyperglycemia 11/10/2017  . Alcohol use 11/23/2016  . Hyperlipidemia 11/11/2016  . Medication monitoring encounter 11/11/2016  . Family history of prostate cancer in father 11/11/2016  . Hypothyroidism due to acquired atrophy of thyroid 11/09/2015  . History of skin cancer 11/09/2015  . Annual physical exam 11/09/2015  . Allergic rhinitis with postnasal drip 11/09/2015  . Screening for STD (sexually transmitted disease) 11/09/2015  . Encounter for cholesteral screening for cardiovascular disease 11/09/2015  . Encounter for screening for malignant neoplasm of prostate 11/09/2015    Past Surgical History:  Procedure Laterality Date  . BACK SURGERY    . CATARACT EXTRACTION W/PHACO Right 11/24/2017   Procedure: CATARACT EXTRACTION PHACO AND INTRAOCULAR LENS PLACEMENT (IOC);  Surgeon: Birder Robson, MD;  Location: [facility] ORS;  Service: Ophthalmology;  Laterality: Right;  [place] 00:28.3 AP% 14.6 CDE 4.12 Fluid pack Lot # [id number]  . CATARACT EXTRACTION W/PHACO Left 12/29/2017   Procedure: CATARACT EXTRACTION PHACO AND INTRAOCULAR LENS PLACEMENT (IOC);  Surgeon: Birder Robson, MD;  Location: [facility] ORS;  Service: Ophthalmology;  Laterality: Left;  [place] 00:30.4 AP% 14.0 CDE 4.25 Fluid Pack Lot # [id number]  . CO2 LASER OF [place]    . COLONOSCOPY WITH PROPOFOL N/A 02/10/2020   Procedure: COLONOSCOPY WITH PROPOFOL;  Surgeon: [name], MD;  Location[facility] ENDOSCOPY;  Service: Gastroenterology;  Laterality: N/A;  COVID POSITIVE ON December 24, 2019  . discetomy    . EYE SURGERY     Cataracts; laser  procedure for torn retina  . HERNIA REPAIR     X 2  . SKIN CANCER DESTRUCTION    . SPINE SURGERY     Partial diskectomy, laminectomy L4-L5    Family History  Problem Relation Age of Onset  . Cancer Father        prostate  . Heart disease Father   . Hypothyroidism Sister   . Heart disease Mother        CHF, atrial fibrillation  . Hyperlipidemia Mother        high TG  . Diabetes Brother   .  Heart disease Brother   . Obesity Brother     Social History   Tobacco Use  . Smoking status: Former Research scientist (life sciences)  . Smokeless tobacco: Never Used  . Tobacco comment: Quit 1985  Vaping Use  . Vaping Use: Never used  Substance Use Topics  . Alcohol use: Yes    Alcohol/week: 28.0 standard drinks    Types: 28 Glasses of wine per week  . Drug use: No     Allergies  Allergen Reactions  . Tetanus Toxoids Swelling    Fever   . Penicillins Swelling and Rash    Has patient had a PCN reaction causing immediate rash, facial/tongue/throat swelling, SOB or lightheadedness with hypotension: yes Has patient had a PCN reaction causing severe rash involving mucus membranes or skin necrosis: no Has patient had a PCN reaction that required hospitalization: no Has patient had a PCN reaction occurring within the last 10 years: no If all of the above answers are "NO", then may proceed with Cephalosporin use.     Health Maintenance  Topic Date Due  . COLONOSCOPY (Pts 45-33yrs Insurance coverage will need to be confirmed)  02/09/2030  . INFLUENZA VACCINE  Completed  . COVID-19 Vaccine  Completed  . Hepatitis C Screening  Completed  . PNA vac Low Risk Adult  Completed  . TETANUS/TDAP  Discontinued    Chart Review Today: I personally reviewed active problem list, medication list, allergies, family history, social history, health maintenance, notes from last encounter, lab results, imaging with the patient/caregiver today.   Review of Systems  Constitutional: Negative.   HENT: Negative.   Eyes:  Negative.   Respiratory: Negative.   Cardiovascular: Negative.   Gastrointestinal: Negative.   Endocrine: Negative.   Genitourinary: Negative.   Musculoskeletal: Negative.   Skin: Negative.   Allergic/Immunologic: Negative.   Neurological: Negative.   Hematological: Negative.   Psychiatric/Behavioral: Negative.   All other systems reviewed and are negative.    Objective:   Vitals:   11/20/20 1011  BP: 116/72  Pulse: (!) 59  Resp: 16  Temp: 98 F (36.7 C)  TempSrc: Oral  SpO2: 98%  Weight: 172 lb 8 oz (78.2 kg)  Height: 6\' 1"  (1.854 m)    Body mass index is 22.76 kg/m.  Physical Exam Vitals and nursing note reviewed.  Constitutional:      General: He is not in acute distress.    Appearance: Normal appearance. He is well-developed and normal weight. He is not ill-appearing, toxic-appearing or diaphoretic.     Interventions: Face mask in place.  HENT:     Head: Normocephalic and atraumatic.     Jaw: No trismus.     Right Ear: External ear normal.     Left Ear: External ear normal.  Eyes:     General: Lids are normal. No scleral icterus.       Right eye: No discharge.        Left eye: No discharge.     Conjunctiva/sclera: Conjunctivae normal.  Neck:     Thyroid: No thyroid mass or thyromegaly.     Trachea: Trachea and phonation normal. No tracheal deviation.  Cardiovascular:     Rate and Rhythm: Normal rate and regular rhythm. Occasional extrasystoles are present.    Pulses: Normal pulses.          Radial pulses are 2+ on the right side and 2+ on the left side.       Posterior tibial pulses are 2+ on the right side  and 2+ on the left side.     Heart sounds: Normal heart sounds. Heart sounds not distant. No murmur heard. No friction rub. No gallop.   Pulmonary:     Effort: Pulmonary effort is normal. No respiratory distress.     Breath sounds: Normal breath sounds. No stridor. No wheezing, rhonchi or rales.  Abdominal:     General: Bowel sounds are normal.  There is no distension.     Palpations: Abdomen is soft.     Comments: abd exam limited - pt ticklish did not tolerate any palpation  Musculoskeletal:     Cervical back: Normal range of motion and neck supple.     Right lower leg: No edema.     Left lower leg: No edema.  Skin:    General: Skin is warm and dry.     Coloration: Skin is not jaundiced.     Findings: No rash.     Nails: There is no clubbing.  Neurological:     Mental Status: He is alert. Mental status is at baseline.     Cranial Nerves: No dysarthria or facial asymmetry.     Motor: No tremor or abnormal muscle tone.     Gait: Gait normal.  Psychiatric:        Mood and Affect: Mood normal.        Speech: Speech normal.        Behavior: Behavior normal. Behavior is cooperative.         Assessment & Plan:     ICD-10-CM   1. Mixed hyperlipidemia  E78.2 Lipid panel    COMPLETE METABOLIC PANEL WITH GFR   Good statin compliance no side effects myalgias or concerns, due for labs  2. Hypothyroidism due to acquired atrophy of thyroid  E03.4 TSH    COMPLETE METABOLIC PANEL WITH GFR    CBC with Differential/Platelet   Patient has lost weight intentionally no other symptoms concerning for chemical hyper or hypothyroid recheck labs and refill meds  3. Prediabetes  E33.29 COMPLETE METABOLIC PANEL WITH GFR    Hemoglobin A1c   Recheck A1c has been stable at about 5.7-5.8 very healthy diet and improved lifestyle and weight loss  4. Screening for malignant neoplasm of prostate  Z12.5 PSA   No lower urinary tract symptoms he does wish to recheck PSA insurance may not cover it he is aware of this and has signed a release  5. Family history of prostate cancer in father  Z44.42 PSA  6. Encounter for medication monitoring  Z51.81 TSH    Lipid panel    COMPLETE METABOLIC PANEL WITH GFR    atorvastatin (LIPITOR) 20 MG tablet    Hemoglobin A1c    CBC with Differential/Platelet   Health maintenance, screening and immunizations all  reviewed today  Return in about 1 year (around 11/20/2021) for Routine follow-up.   Delsa Grana, PA-C 11/20/20 10:28 AM

## 2020-11-20 NOTE — Patient Instructions (Signed)
I will let you know your lab results and adjust thyroid meds if needed.  Health Maintenance  Topic Date Due  . Colon Cancer Screening  02/09/2030  . Flu Shot  Completed  . COVID-19 Vaccine  Completed  .  Hepatitis C: One time screening is recommended by Center for Disease Control  (CDC) for  adults born from [birth year] through 1964.   Completed  . Pneumonia vaccines  Completed  . Tetanus Vaccine  Discontinued     Health Maintenance After Age 74 After age 82, you are at a higher risk for certain long-term diseases and infections as well as injuries from falls. Falls are a major cause of broken bones and head injuries in people who are older than age 74. Getting regular preventive care can help to keep you healthy and well. Preventive care includes getting regular testing and making lifestyle changes as recommended by your health care provider. Talk with your health care provider about:  Which screenings and tests you should have. A screening is a test that checks for a disease when you have no symptoms.  A diet and exercise plan that is right for you. What should I know about screenings and tests to prevent falls? Screening and testing are the best ways to find a health problem early. Early diagnosis and treatment give you the best chance of managing medical conditions that are common after age 100. Certain conditions and lifestyle choices may make you more likely to have a fall. Your health care provider may recommend:  Regular vision checks. Poor vision and conditions such as cataracts can make you more likely to have a fall. If you wear glasses, make sure to get your prescription updated if your vision changes.  Medicine review. Work with your health care provider to regularly review all of the medicines you are taking, including over-the-counter medicines. Ask your health care provider about any side effects that may make you more likely to have a fall. Tell your health care provider if any  medicines that you take make you feel dizzy or sleepy.  Osteoporosis screening. Osteoporosis is a condition that causes the bones to get weaker. This can make the bones weak and cause them to break more easily.  Blood pressure screening. Blood pressure changes and medicines to control blood pressure can make you feel dizzy.  Strength and balance checks. Your health care provider may recommend certain tests to check your strength and balance while standing, walking, or changing positions.  Foot health exam. Foot pain and numbness, as well as not wearing proper footwear, can make you more likely to have a fall.  Depression screening. You may be more likely to have a fall if you have a fear of falling, feel emotionally low, or feel unable to do activities that you used to do.  Alcohol use screening. Using too much alcohol can affect your balance and may make you more likely to have a fall. What actions can I take to lower my risk of falls? General instructions  Talk with your health care provider about your risks for falling. Tell your health care provider if: ? You fall. Be sure to tell your health care provider about all falls, even ones that seem minor. ? You feel dizzy, sleepy, or off-balance.  Take over-the-counter and prescription medicines only as told by your health care provider. These include any supplements.  Eat a healthy diet and maintain a healthy weight. A healthy diet includes low-fat dairy products, low-fat (lean) meats,  and fiber from whole grains, beans, and lots of fruits and vegetables. Home safety  Remove any tripping hazards, such as rugs, cords, and clutter.  Install safety equipment such as grab bars in bathrooms and safety rails on stairs.  Keep rooms and walkways well-lit. Activity  Follow a regular exercise program to stay fit. This will help you maintain your balance. Ask your health care provider what types of exercise are appropriate for you.  If you need  a cane or walker, use it as recommended by your health care provider.  Wear supportive shoes that have nonskid soles.   Lifestyle  Do not drink alcohol if your health care provider tells you not to drink.  If you drink alcohol, limit how much you have: ? 0-1 drink a day for women. ? 0-2 drinks a day for men.  Be aware of how much alcohol is in your drink. In the [place] one drink equals one typical bottle of beer (12 oz), one-half glass of wine (5 oz), or one shot of hard liquor (1 oz).  Do not use any products that contain nicotine or tobacco, such as cigarettes and e-cigarettes. If you need help quitting, ask your health care provider. Summary  Having a healthy lifestyle and getting preventive care can help to protect your health and wellness after age 58.  Screening and testing are the best way to find a health problem early and help you avoid having a fall. Early diagnosis and treatment give you the best chance for managing medical conditions that are more common for people who are older than age 62.  Falls are a major cause of broken bones and head injuries in people who are older than age 59. Take precautions to prevent a fall at home.  Work with your health care provider to learn what changes you can make to improve your health and wellness and to prevent falls. This information is not intended to replace advice given to you by your health care provider. Make sure you discuss any questions you have with your health care provider. Document Revised: 01/20/2019 Document Reviewed: 08/12/2017 Elsevier Patient Education  2021 [employer].

## 2020-11-21 ENCOUNTER — Telehealth: Payer: Self-pay

## 2020-11-21 LAB — CBC WITH DIFFERENTIAL/PLATELET
Absolute Monocytes: 319 cells/uL (ref 200–950)
Basophils Absolute: 19 cells/uL (ref 0–200)
Basophils Relative: 0.5 %
Eosinophils Absolute: 220 cells/uL (ref 15–500)
Eosinophils Relative: 5.8 %
HCT: 44.9 % (ref 38.5–50.0)
Hemoglobin: 15.2 g/dL (ref 13.2–17.1)
Lymphs Abs: 1269 cells/uL (ref 850–3900)
MCH: 33.9 pg — ABNORMAL HIGH (ref 27.0–33.0)
MCHC: 33.9 g/dL (ref 32.0–36.0)
MCV: 100 fL (ref 80.0–100.0)
MPV: 10.1 fL (ref 7.5–12.5)
Monocytes Relative: 8.4 %
Neutro Abs: 1972 cells/uL (ref 1500–7800)
Neutrophils Relative %: 51.9 %
Platelets: 211 10*3/uL (ref 140–400)
RBC: 4.49 10*6/uL (ref 4.20–5.80)
RDW: 13 % (ref 11.0–15.0)
Total Lymphocyte: 33.4 %
WBC: 3.8 10*3/uL (ref 3.8–10.8)

## 2020-11-21 LAB — COMPLETE METABOLIC PANEL WITH GFR
AG Ratio: 1.9 (calc) (ref 1.0–2.5)
ALT: 30 U/L (ref 9–46)
AST: 25 U/L (ref 10–35)
Albumin: 4.6 g/dL (ref 3.6–5.1)
Alkaline phosphatase (APISO): 90 U/L (ref 35–144)
BUN: 20 mg/dL (ref 7–25)
CO2: 30 mmol/L (ref 20–32)
Calcium: 9.4 mg/dL (ref 8.6–10.3)
Chloride: 105 mmol/L (ref 98–110)
Creat: 0.79 mg/dL (ref 0.70–1.18)
GFR, Est African American: 103 mL/min/{1.73_m2} (ref >=60)
GFR, Est Non African American: 89 mL/min/{1.73_m2} (ref >=60)
Globulin: 2.4 g/dL (calc) (ref 1.9–3.7)
Glucose, Bld: 101 mg/dL — ABNORMAL HIGH (ref 65–99)
Potassium: 4.8 mmol/L (ref 3.5–5.3)
Sodium: 141 mmol/L (ref 135–146)
Total Bilirubin: 0.8 mg/dL (ref 0.2–1.2)
Total Protein: 7 g/dL (ref 6.1–8.1)

## 2020-11-21 LAB — LIPID PANEL
Cholesterol: 148 mg/dL (ref <200)
HDL: 75 mg/dL (ref >=40)
LDL Cholesterol (Calc): 60 mg/dL (calc)
Non-HDL Cholesterol (Calc): 73 mg/dL (calc) (ref <130)
Total CHOL/HDL Ratio: 2 (calc) (ref <5.0)
Triglycerides: 44 mg/dL (ref <150)

## 2020-11-21 LAB — TSH: TSH: 0.29 mIU/L — ABNORMAL LOW (ref 0.40–4.50)

## 2020-11-21 LAB — HEMOGLOBIN A1C
Hgb A1c MFr Bld: 5.4 % of total Hgb (ref <5.7)
Mean Plasma Glucose: 108 mg/dL
eAG (mmol/L): 6 mmol/L

## 2020-11-21 LAB — PSA: PSA: 0.69 ng/mL (ref <=4.0)

## 2020-11-21 MED ORDER — LEVOTHYROXINE SODIUM 112 MCG PO TABS: 112.0000 ug | ORAL_TABLET | Freq: Every day | ORAL | 3 refills | Status: DC

## 2020-11-21 NOTE — Addendum Note (Signed)
Addended by: Delsa Grana on: 11/21/2020 01:19 PM   Modules accepted: Orders

## 2020-11-21 NOTE — Telephone Encounter (Signed)
-----   Message from Delsa Grana, [place] sent at 11/21/2020  1:22 PM EST ----- Please call with labs and med changes  Cholesterol well controlled, A1C in normal range Kidney and liver function good, electrolytes normal PSA same as prior labs - looks good   TSH was abnormal low - which means he is getting too much thyroid hormone - I have send in a slightly reduced dose - stop 125 mcg levothyroxine daily and start 112 mcg levothyroxine daily in the morning before breakfast  Needs to come by 6 to 8 weeks after he starts the new dose and repeat the TSH lab to make sure it normalizes If he has any bothersome symptoms or concerns about the med change or f/up labs - please have him schedule an appt to discuss shortly after labs are completed.    thanks

## 2020-11-21 NOTE — Progress Notes (Signed)
Lab results: Results for orders placed or performed in visit on 11/20/20  TSH  Result Value Ref Range   TSH 0.29 (L) 0.40 - 4.50 mIU/L  Lipid panel  Result Value Ref Range   Cholesterol 148 <200 mg/dL   HDL 75 > OR = 40 mg/dL   Triglycerides 44 <150 mg/dL   LDL Cholesterol (Calc) 60 mg/dL (calc)   Total CHOL/HDL Ratio 2.0 <5.0 (calc)   Non-HDL Cholesterol (Calc) 73 <130 mg/dL (calc)  COMPLETE METABOLIC PANEL WITH GFR  Result Value Ref Range   Glucose, Bld 101 (H) 65 - 99 mg/dL   BUN 20 7 - 25 mg/dL   Creat 0.79 0.70 - 1.18 mg/dL   GFR, Est Non African American 89 > OR = 60 mL/min/1.26m2   GFR, Est African American 103 > OR = 60 mL/min/1.15m2   BUN/Creatinine Ratio NOT APPLICABLE 6 - 22 (calc)   Sodium 141 135 - 146 mmol/L   Potassium 4.8 3.5 - 5.3 mmol/L   Chloride 105 98 - 110 mmol/L   CO2 30 20 - 32 mmol/L   Calcium 9.4 8.6 - 10.3 mg/dL   Total Protein 7.0 6.1 - 8.1 g/dL   Albumin 4.6 3.6 - 5.1 g/dL   Globulin 2.4 1.9 - 3.7 g/dL (calc)   AG Ratio 1.9 1.0 - 2.5 (calc)   Total Bilirubin 0.8 0.2 - 1.2 mg/dL   Alkaline phosphatase (APISO) 90 35 - 144 U/L   AST 25 10 - 35 U/L   ALT 30 9 - 46 U/L  Hemoglobin A1c  Result Value Ref Range   Hgb A1c MFr Bld 5.4 <5.7 % of total Hgb   Mean Plasma Glucose 108 mg/dL   eAG (mmol/L) 6.0 mmol/L  CBC with Differential/Platelet  Result Value Ref Range   WBC 3.8 3.8 - 10.8 Thousand/uL   RBC 4.49 4.20 - 5.80 Million/uL   Hemoglobin 15.2 13.2 - 17.1 g/dL   HCT 44.9 38.5 - 50.0 %   MCV 100.0 80.0 - 100.0 fL   MCH 33.9 (H) 27.0 - 33.0 pg   MCHC 33.9 32.0 - 36.0 g/dL   RDW 13.0 11.0 - 15.0 %   Platelets 211 140 - 400 Thousand/uL   MPV 10.1 7.5 - 12.5 fL   Neutro Abs 1,972 1,500 - 7,800 cells/uL   Lymphs Abs 1,269 850 - 3,900 cells/uL   Absolute Monocytes 319 200 - 950 cells/uL   Eosinophils Absolute 220 15 - 500 cells/uL   Basophils Absolute 19 0 - 200 cells/uL   Neutrophils Relative % 51.9 %   Total Lymphocyte 33.4 %   Monocytes  Relative 8.4 %   Eosinophils Relative 5.8 %   Basophils Relative 0.5 %  PSA  Result Value Ref Range   PSA 0.69 < OR = 4.0 ng/mL   TSH abnormal low - med dose likely a little too high for him now with weight loss - decrease dose from 125 mcg daily to 112 mcg daily and repeat labs in 6-8 weeks- additional TSH added  Meds ordered this encounter  Medications  . levothyroxine (SYNTHROID) 112 MCG tablet    Sig: Take 1 tablet (112 mcg total) by mouth daily before breakfast.    Dispense:  90 tablet    Refill:  3    Dose change   F/up on TSH results - needs appt for f/up if sx or concerns (6-8 weeks)  Delsa Grana, PA-C

## 2020-11-22 NOTE — Telephone Encounter (Signed)
Pt was called and notified yesterday

## 2021-01-15 DIAGNOSIS — Z23 Encounter for immunization: Secondary | ICD-10-CM | POA: Diagnosis not present

## 2021-05-13 NOTE — Progress Notes (Signed)
05/14/2021 2:53 PM   Carlena Hurl [birth year] [id number]  Referring provider: Delsa Grana, PA-C [address] [place],  [place] [address] No chief complaint on file.   HPI: Marc Jackson is a 75 y.o. male who presents today with self referral for blood in semen.  He has a family history of prostate cancer. PCP is following his PSA trends.   States that his semen has become really dark. 05/09/2021 he states he checked semen and it was extremely dark with a color of "brownish-red".   This persisted for a total of 3 times.  He is not ejaculated since.  Wife was present today.   He denies any urinary symptoms.  He does sometimes get up once a night to void but otherwise has no significant issues.  He denies burning with urination.  He denies urgency frequency.  He does mention today that he is an avid biker but not had any issues with his prostate related to this in the past.   PSA trend  Component     Latest Ref Rng & Units 11/11/2016 11/10/2017 05/17/2018 11/17/2019  PSA     < OR = 4.0 ng/mL 0.7 1.0 0.7 0.7   Component     Latest Ref Rng & Units 11/20/2020  PSA     < OR = 4.0 ng/mL 0.69    PMH: Past Medical History:  Diagnosis Date   Alcohol use 11/23/2016   Allergic rhinitis with postnasal drip 11/09/2015   Annual physical exam 11/09/2015   Cancer ([place])    SKIN   Cataract    Hyperglycemia 11/10/2017   Hyperlipidemia    Hypothyroidism    IBS (irritable bowel syndrome)    Thyroid disease     Surgical History: Past Surgical History:  Procedure Laterality Date   BACK SURGERY     CATARACT EXTRACTION W/PHACO Right 11/24/2017   Procedure: CATARACT EXTRACTION PHACO AND INTRAOCULAR LENS PLACEMENT ([place]);  Surgeon: Birder Robson, MD;  Location: [facility] ORS;  Service: Ophthalmology;  Laterality: Right;  [place] 00:28.3 AP% 14.6 CDE 4.12 Fluid pack Lot # [id number]   CATARACT EXTRACTION W/PHACO Left 12/29/2017   Procedure: CATARACT EXTRACTION PHACO AND INTRAOCULAR LENS  PLACEMENT ([facility]);  Surgeon: Birder Robson, MD;  Location: [facility] ORS;  Service: Ophthalmology;  Laterality: Left;  [place] 00:30.4 AP% 14.0 CDE 4.25 Fluid Pack Lot # [id number]   CO2 LASER OF [place]OP[place]KIA     COLONOSCOPY WITH PROPOFOL N/A 02/10/2020   Procedure: COLONOSCOPY WITH PROPOFOL;  Surgeon: [name], MD;  Location: [facility] ENDOSCOPY;  Service: Gastroenterology;  Laterality: N/A;  COVID POSITIVE ON December 24, 2019   discetomy     EYE SURGERY     Cataracts; laser procedure for torn retina   HERNIA REPAIR     X 2   SKIN CANCER DESTRUCTION     SPINE SURGERY     Partial diskectomy, laminectomy L4-L5    Home Medications:  Allergies as of 05/14/2021       Reactions   Tetanus Toxoids Swelling   Fever    Penicillins Swelling, Rash   Has patient had a PCN reaction causing immediate rash, facial/tongue/throat swelling, SOB or lightheadedness with hypotension: yes Has patient had a PCN reaction causing severe rash involving mucus membranes or skin necrosis: no Has patient had a PCN reaction that required hospitalization: no Has patient had a PCN reaction occurring within the last 10 years: no If all of the above answers are "NO", then may proceed with Cephalosporin use.  Medication List        Accurate as of May 14, 2021  2:53 PM. If you have any questions, ask your nurse or doctor.          aspirin EC 81 MG tablet Take 81 mg by mouth 4 (four) times a week.   atorvastatin 20 MG tablet Commonly known as: LIPITOR Take 1 tablet (20 mg total) by mouth at bedtime.   levothyroxine 112 MCG tablet Commonly known as: SYNTHROID Take 1 tablet (112 mcg total) by mouth daily before breakfast.        Allergies:  Allergies  Allergen Reactions   Tetanus Toxoids Swelling    Fever    Penicillins Swelling and Rash    Has patient had a PCN reaction causing immediate rash, facial/tongue/throat swelling, SOB or lightheadedness with hypotension: yes Has patient had a PCN reaction  causing severe rash involving mucus membranes or skin necrosis: no Has patient had a PCN reaction that required hospitalization: no Has patient had a PCN reaction occurring within the last 10 years: no If all of the above answers are "NO", then may proceed with Cephalosporin use.     Family History: Family History  Problem Relation Age of Onset   Cancer Father        prostate   Heart disease Father    Hypothyroidism Sister    Heart disease Mother        CHF, atrial fibrillation   Hyperlipidemia Mother        high TG   Diabetes Brother    Heart disease Brother    Obesity Brother     Social History:  reports that he has quit smoking. He has never used smokeless tobacco. He reports current alcohol use of about 28.0 standard drinks of alcohol per week. He reports that he does not use drugs.   Physical Exam: BP 124/70   Pulse 61   Ht '6\' 1"'$  (1.854 m)   Wt 172 lb (78 kg)   BMI 22.69 kg/m   Constitutional:  Alert and oriented, No acute distress.  Wife present today HEENT: [place] AT, moist mucus membranes.  Trachea midline, no masses. Cardiovascular: No clubbing, cyanosis, or edema. Respiratory: Normal respiratory effort, no increased work of breathing. GI: Abdomen is soft, nontender, nondistended, no abdominal masses GU: Normal phallus, bilateral descended testicles without masses or lesions Rectal: Normal sphincter tone, 40  CC prostate, smooth no nodules. Diffusely firm.  Skin: No rashes, bruises or suspicious lesions. Neurologic: Grossly intact, no focal deficits, moving all 4 extremities. Psychiatric: Normal mood and affect.  Laboratory Data:  Lab Results  Component Value Date   CREATININE 0.79 11/20/2020    Lab Results  Component Value Date   PSA 0.69 11/20/2020   PSA 0.7 11/17/2019   PSA 0.7 05/17/2018    No results found for: TESTOSTERONE  Lab Results  Component Value Date   HGBA1C 5.4 11/20/2020    Urinalysis:  05/14/2021 Urinalysis negative, see  epic  No microscopic blood   Assessment & Plan:    Hematospermia  I explained to the patient some of the conditions that may cause hematospermia, such as: disorders of the prostate gland, seminal vesicles, spermatic cord, and ejaculatory duct system; urogenital infections including sexually transmitted infections (eg, chlamydia, herpes simplex virus, gonorrhea, trichomonas); metastatic cancers; vascular malformations; congenital and drug-induced bleeding disorders; and even frequent daily ejaculation over a period of several weeks.  I reassured him that his exam was normal and that we may never  discover a reason for his hematospermia and it is most likely a benign symptom.     Setting of normal exam as well as normal urinalysis and other labs in the absence of other symptoms, suspect this is a benign cause.  2. Family history of prostate cancer -PSA screening up-to-date, normal rectal exam with stable PSA  Can follow-up as needed  Conley Rolls as a scribe for Hollice Espy, MD.,have documented all relevant documentation on the behalf of Hollice Espy, MD,as directed by  Hollice Espy, MD while in the presence of Hollice Espy, MD.  Hollice Espy, MD   [facility] [address] [place], [place] [address] [phone]

## 2021-05-14 ENCOUNTER — Encounter: Payer: Self-pay | Admitting: Urology

## 2021-05-14 ENCOUNTER — Other Ambulatory Visit: Payer: Self-pay

## 2021-05-14 ENCOUNTER — Ambulatory Visit (INDEPENDENT_AMBULATORY_CARE_PROVIDER_SITE_OTHER): Payer: Medicare Other | Admitting: Urology

## 2021-05-14 VITALS — BP 124/70 | HR 61 | Ht 73.0 in | Wt 172.0 lb

## 2021-05-14 DIAGNOSIS — R361 Hematospermia: Secondary | ICD-10-CM

## 2021-05-14 LAB — MICROSCOPIC EXAMINATION
Bacteria, UA: NONE SEEN
Epithelial Cells (non renal): NONE SEEN /hpf (ref 0–10)
WBC, UA: NONE SEEN /hpf (ref 0–5)

## 2021-05-14 LAB — URINALYSIS, COMPLETE
Bilirubin, UA: NEGATIVE
Glucose, UA: NEGATIVE
Ketones, UA: NEGATIVE
Leukocytes,UA: NEGATIVE
Nitrite, UA: NEGATIVE
Protein,UA: NEGATIVE
RBC, UA: NEGATIVE
Specific Gravity, UA: 1.01 (ref 1.005–1.030)
Urobilinogen, Ur: 0.2 mg/dL (ref 0.2–1.0)
pH, UA: 6.5 (ref 5.0–7.5)

## 2021-05-14 NOTE — Patient Instructions (Signed)
Blood in Semen Haematospermia is a condition that refers to the presence of visible blood in ejaculate (semen); it can range from completely red to blood-tinged. This is different from blood seen when passing urine (referred to as haematuria).  Although alarming, the most common causes are due to low-grade infection or inflammation. It is also commonly seen following urological procedures such as prostate biopsy, vasectomy and radiotherapy. A large proportion of patients will not have an obvious cause identified. Reassuringly, symptoms will frequently resolve on their own requiring no need for specific medical or surgical treatment   Blood in Semen treatment Blood in the semen will often settle down on its own. It is most commonly caused by a low-grade infection or inflammation of the area around the prostate (seminal tract). Your GP may provide you with a course of antibiotics if infection is identified or suspected.  If you have recently had a related procedure, symptoms can last for up to 3-4 weeks following a prostate biopsy and for a week or more following recent vasectomy. In these cases symptoms often self-resolve without any need for specific treatment.

## 2021-08-02 DIAGNOSIS — Z23 Encounter for immunization: Secondary | ICD-10-CM | POA: Diagnosis not present

## 2021-08-16 DIAGNOSIS — H26492 Other secondary cataract, left eye: Secondary | ICD-10-CM | POA: Diagnosis not present

## 2021-09-09 DIAGNOSIS — D2262 Melanocytic nevi of left upper limb, including shoulder: Secondary | ICD-10-CM | POA: Diagnosis not present

## 2021-09-09 DIAGNOSIS — L821 Other seborrheic keratosis: Secondary | ICD-10-CM | POA: Diagnosis not present

## 2021-09-09 DIAGNOSIS — Z85828 Personal history of other malignant neoplasm of skin: Secondary | ICD-10-CM | POA: Diagnosis not present

## 2021-09-09 DIAGNOSIS — D2261 Melanocytic nevi of right upper limb, including shoulder: Secondary | ICD-10-CM | POA: Diagnosis not present

## 2021-09-09 DIAGNOSIS — D2272 Melanocytic nevi of left lower limb, including hip: Secondary | ICD-10-CM | POA: Diagnosis not present

## 2021-09-09 DIAGNOSIS — D225 Melanocytic nevi of trunk: Secondary | ICD-10-CM | POA: Diagnosis not present

## 2021-09-30 ENCOUNTER — Ambulatory Visit: Payer: Self-pay

## 2021-09-30 ENCOUNTER — Telehealth: Payer: Medicare Other | Admitting: Family

## 2021-09-30 DIAGNOSIS — U071 COVID-19: Secondary | ICD-10-CM

## 2021-09-30 MED ORDER — MOLNUPIRAVIR EUA 200MG CAPSULE: 4.0000 | ORAL_CAPSULE | Freq: Two times a day (BID) | ORAL | 0 refills | Status: AC

## 2021-09-30 NOTE — Telephone Encounter (Signed)
°  Chief Complaint: COVID Symptoms: congestion, cough, sneezing, scratchy throat, fever Frequency: 2-3 days Pertinent Negatives: NA Disposition: [] ED /[] Urgent Care (no appt availability in office) / [] Appointment(In office/virtual)/ [x]  [place] [facility]/ [] Home Care/ [] Refused Recommended Disposition  Additional Notes: pt symptoms started Saturday and tested positive today.pt was scheduled for virtual UC visit today at 1900.     Reason for Disposition  [1] COVID-19 infection suspected by caller or triager AND [2] mild symptoms (cough, fever, or others) AND [3] negative COVID-19 rapid test  Answer Assessment - Initial Assessment Questions 1. COVID-19 DIAGNOSIS: "Who made your COVID-19 diagnosis?" "Was it confirmed by a positive lab test or self-test?" If not diagnosed by a doctor (or NP/PA), ask "Are there lots of cases (community spread) where you live?" Note: See public health department website, if unsure.     Home test  3. ONSET: "When did the COVID-19 symptoms start?"     Saturday 4. WORST SYMPTOM: "What is your worst symptom?" (e.g., cough, fever, shortness of breath, muscle aches)     Congestion  5. COUGH: "Do you have a cough?" If Yes, ask: "How bad is the cough?"       yes 6. FEVER: "Do you have a fever?" If Yes, ask: "What is your temperature, how was it measured, and when did it start?"     yes 7. RESPIRATORY STATUS: "Describe your breathing?" (e.g., shortness of breath, wheezing, unable to speak)      NO 10. VACCINE: "Have you had the COVID-19 vaccine?" If Yes, ask: "Which one, how many shots, when did you get it?"       yes 11. BOOSTER: "Have you received your COVID-19 booster?" If Yes, ask: "Which one and when did you get it?" yes 13. OTHER SYMPTOMS: "Do you have any other symptoms?"  (e.g., chills, fatigue, headache, loss of smell or taste, muscle pain, sore throat)       Sneezing, cough, scratchy throat, fever, drainage  Protocols used: Coronavirus (COVID-19)  Diagnosed or Suspected-A-AH

## 2021-09-30 NOTE — Patient Instructions (Signed)
10 Things You Can Do to Manage Your COVID-19 Symptoms at Home ?If you have possible or confirmed COVID-19 ?Stay home except to get medical care. ?Monitor your symptoms carefully. If your symptoms get worse, call your healthcare provider immediately. ?Get rest and stay hydrated. ?If you have a medical appointment, call the healthcare provider ahead of time and tell them that you have or may have COVID-19. ?For medical emergencies, call 911 and notify the dispatch personnel that you have or may have COVID-19. ?Cover your cough and sneezes with a tissue or use the inside of your elbow. ?Wash your hands often with soap and water for at least 20 seconds or clean your hands with an alcohol-based hand sanitizer that contains at least 60% alcohol. ?As much as possible, stay in a specific room and away from other people in your home. Also, you should use a separate bathroom, if available. If you need to be around other people in or outside of the home, wear a mask. ?Avoid sharing personal items with other people in your household, like dishes, towels, and bedding. ?Clean all surfaces that are touched often, like counters, tabletops, and doorknobs. Use household cleaning sprays or wipes according to the label instructions. ?cdc.gov/coronavirus ?04/27/2020 ?This information is not intended to replace advice given to you by your health care provider. Make sure you discuss any questions you have with your health care provider. ?Document Revised: 06/21/2021 Document Reviewed: 06/21/2021 ?Elsevier Patient Education ? 2022 [employer] ? ?

## 2021-09-30 NOTE — Telephone Encounter (Signed)
Patient called, left VM to return the call to the office to discuss symptoms with a nurse.   Pt called saying he and his wife tested positive for covid.  His wife Christella Scheuermann 02/23/48. has a cough, nasal congestion.  Mr. Carpenter cough, congestion, low grade fever and headache.   They are asking for oral medication for Covid Virus.   [place]   CB#  [phone]

## 2021-09-30 NOTE — Progress Notes (Signed)
Virtual Visit Consent   Marc Jackson, you are scheduled for a virtual visit with a [place] provider today.     Just as with appointments in the office, your consent must be obtained to participate.  Your consent will be active for this visit and any virtual visit you may have with one of our providers in the next 365 days.     If you have a MyChart account, a copy of this consent can be sent to you electronically.  All virtual visits are billed to your insurance company just like a traditional visit in the office.    As this is a virtual visit, video technology does not allow for your provider to perform a traditional examination.  This may limit your provider's ability to fully assess your condition.  If your provider identifies any concerns that need to be evaluated in person or the need to arrange testing (such as labs, EKG, etc.), we will make arrangements to do so.     Although advances in technology are sophisticated, we cannot ensure that it will always work on either your end or our end.  If the connection with a video visit is poor, the visit may have to be switched to a telephone visit.  With either a video or telephone visit, we are not always able to ensure that we have a secure connection.     I need to obtain your verbal consent now.   Are you willing to proceed with your visit today?    Marc Jackson has provided verbal consent on 09/30/2021 for a virtual visit (video or telephone).   Marc Dun, FNP   Date: 09/30/2021 7:02 PM   Virtual Visit via Video Note   I, Marc Jackson, connected with  Marc Jackson  ([id number], 11-05-1945) on 09/30/21 at  7:00 PM EST by a video-enabled telemedicine application and verified that I am speaking with the correct person using two identifiers.  Location: Patient: Virtual Visit Location Patient: Home Provider: Virtual Visit Location Provider: Home Office   I discussed the limitations of evaluation and management by telemedicine  and the availability of in person appointments. The patient expressed understanding and agreed to proceed.    History of Present Illness: Marc Jackson is a 75 y.o. who identifies as a male who was assigned male at birth, and is being seen today for COVID. He reports his symptoms started two days ago and tested this morning.   HPI: Cough This is a new problem. The current episode started in the past 7 days. The problem has been waxing and waning. The problem occurs every few minutes. The cough is Non-productive. Associated symptoms include chills, a fever, headaches, nasal congestion and postnasal drip. Pertinent negatives include no ear congestion, ear pain, shortness of breath or wheezing. He has tried rest and OTC cough suppressant for the symptoms.   Problems:  Patient Active Problem List   Diagnosis Date Noted   Prediabetes 11/17/2019   Hyperlipidemia 11/11/2016   Family history of prostate cancer in father 11/11/2016   Hypothyroidism due to acquired atrophy of thyroid 11/09/2015   History of skin cancer 11/09/2015    Allergies:  Allergies  Allergen Reactions   Tetanus Toxoids Swelling    Fever    Penicillins Swelling and Rash    Has patient had a PCN reaction causing immediate rash, facial/tongue/throat swelling, SOB or lightheadedness with hypotension: yes Has patient had a PCN reaction causing severe rash involving mucus membranes or  skin necrosis: no Has patient had a PCN reaction that required hospitalization: no Has patient had a PCN reaction occurring within the last 10 years: no If all of the above answers are "NO", then may proceed with Cephalosporin use.    Medications:  Current Outpatient Medications:    molnupiravir EUA (LAGEVRIO) 200 mg CAPS capsule, Take 4 capsules (800 mg total) by mouth 2 (two) times daily for 5 days., Disp: 40 capsule, Rfl: 0   aspirin EC 81 MG tablet, Take 81 mg by mouth 4 (four) times a week., Disp: , Rfl:    atorvastatin (LIPITOR) 20 MG  tablet, Take 1 tablet (20 mg total) by mouth at bedtime., Disp: 90 tablet, Rfl: 3   levothyroxine (SYNTHROID) 112 MCG tablet, Take 1 tablet (112 mcg total) by mouth daily before breakfast., Disp: 90 tablet, Rfl: 3  Observations/Objective: Patient is well-developed, well-nourished in no acute distress.  Resting comfortably  at home.  Head is normocephalic, atraumatic.  No labored breathing.  Speech is clear and coherent with logical content.  Patient is alert and oriented at baseline.  Nasal congestion  Assessment and Plan: 1. COVID-19 - molnupiravir EUA (LAGEVRIO) 200 mg CAPS capsule; Take 4 capsules (800 mg total) by mouth 2 (two) times daily for 5 days.  Dispense: 40 capsule; Refill: 0  COVID positive, rest, force fluids, tylenol as needed, Quarantine for at least 5 days and you are fever free, then must wear a mask out in public from day 2-91, report any worsening symptoms such as increased shortness of breath, swelling, or continued high fevers. Possible adverse effects discussed with antivirals.    Follow Up Instructions: I discussed the assessment and treatment plan with the patient. The patient was provided an opportunity to ask questions and all were answered. The patient agreed with the plan and demonstrated an understanding of the instructions.  A copy of instructions were sent to the patient via MyChart unless otherwise noted below.     The patient was advised to call back or seek an in-person evaluation if the symptoms worsen or if the condition fails to improve as anticipated.  Time:  I spent 8 minutes with the patient via telehealth technology discussing the above problems/concerns.    Marc Dun, FNP

## 2021-11-21 ENCOUNTER — Ambulatory Visit (INDEPENDENT_AMBULATORY_CARE_PROVIDER_SITE_OTHER): Payer: Medicare Other | Admitting: Internal Medicine

## 2021-11-21 ENCOUNTER — Other Ambulatory Visit: Payer: Self-pay

## 2021-11-21 ENCOUNTER — Ambulatory Visit (INDEPENDENT_AMBULATORY_CARE_PROVIDER_SITE_OTHER): Payer: Medicare Other

## 2021-11-21 ENCOUNTER — Ambulatory Visit: Payer: Medicare Other | Admitting: Family Medicine

## 2021-11-21 ENCOUNTER — Encounter: Payer: Self-pay | Admitting: Internal Medicine

## 2021-11-21 VITALS — BP 114/64 | HR 55 | Temp 98.0°F | Resp 16 | Ht 73.0 in | Wt 176.1 lb

## 2021-11-21 DIAGNOSIS — Z23 Encounter for immunization: Secondary | ICD-10-CM

## 2021-11-21 DIAGNOSIS — Z125 Encounter for screening for malignant neoplasm of prostate: Secondary | ICD-10-CM | POA: Diagnosis not present

## 2021-11-21 DIAGNOSIS — R7303 Prediabetes: Secondary | ICD-10-CM

## 2021-11-21 DIAGNOSIS — Z0000 Encounter for general adult medical examination without abnormal findings: Secondary | ICD-10-CM

## 2021-11-21 DIAGNOSIS — E034 Atrophy of thyroid (acquired): Secondary | ICD-10-CM

## 2021-11-21 DIAGNOSIS — Z5181 Encounter for therapeutic drug level monitoring: Secondary | ICD-10-CM

## 2021-11-21 DIAGNOSIS — E782 Mixed hyperlipidemia: Secondary | ICD-10-CM | POA: Diagnosis not present

## 2021-11-21 MED ORDER — ATORVASTATIN CALCIUM 20 MG PO TABS: 20.0000 mg | ORAL_TABLET | Freq: Every day | ORAL | 3 refills | Status: DC

## 2021-11-21 MED ORDER — LEVOTHYROXINE SODIUM 112 MCG PO TABS: 112.0000 ug | ORAL_TABLET | Freq: Every day | ORAL | 3 refills | Status: DC

## 2021-11-21 NOTE — Assessment & Plan Note (Signed)
Stable on Levothyroxine 112, recheck levels today and refilled.

## 2021-11-21 NOTE — Progress Notes (Signed)
Established Patient Office Visit  Subjective:  Patient ID: Marc Jackson, male    DOB: May 12, 1946  Age: 76 y.o. MRN: [id number]  CC:  Chief Complaint  Patient presents with   Follow-up   Hyperlipidemia   Hypertension    HPI Marc Jackson presents for follow up on chronic medical conditions.   Hypothyroidism: -Medications: Levothyroxine 112 -Patient is compliant with the above medication (s) at the above dose and reports no medication side effects.  -Denies weight changes, cold./heat intolerance, skin changes, anxiety/palpitations  -Last TSH: 2/22: 0.29 - dose decreased at that time from 125 to 112  HLD: -Medications: Lipitor 20 -Patient is compliant with above medications and reports no side effects.  -Last lipid panel: 2/22 Lipid Panel     Component Value Date/Time   CHOL 148 11/20/2020 0000   CHOL 217 (H) 11/09/2015 1148   TRIG 44 11/20/2020 0000   HDL 75 11/20/2020 0000   HDL 43 11/09/2015 1148   CHOLHDL 2.0 11/20/2020 0000   VLDL 34 (H) 11/11/2016 0915   LDLCALC 60 11/20/2020 0000   LABVLDL 38 11/09/2015 1148   The 10-year ASCVD risk score (Arnett DK, et al., 2019) is: 15.2%   Values used to calculate the score:     Age: 67 years     Sex: Male     Is Non-Hispanic African American: No     Diabetic: No     Tobacco smoker: No     Systolic Blood Pressure: 532 mmHg     Is BP treated: No     HDL Cholesterol: 75 mg/dL     Total Cholesterol: 148 mg/dL  Pre-Diabetes: -Last A1c 5.4 2/22 -Not currently on any medications  Health Maintenance: -Blood work due -Colonoscopy 4/21, repeat in 10 years  Past Medical History:  Diagnosis Date   Alcohol use 11/23/2016   Allergic rhinitis with postnasal drip 11/09/2015   Allergy    Penicillin, Tetnus Toxoid   Annual physical exam 11/09/2015   Cancer ([place])    SKIN   Cataract    Hyperglycemia 11/10/2017   Hyperlipidemia    Hypothyroidism    IBS (irritable bowel syndrome)    Thyroid disease     Past Surgical  History:  Procedure Laterality Date   BACK SURGERY     CATARACT EXTRACTION W/PHACO Right 11/24/2017   Procedure: CATARACT EXTRACTION PHACO AND INTRAOCULAR LENS PLACEMENT ([place]);  Surgeon: Birder Robson, MD;  Location: [facility] ORS;  Service: Ophthalmology;  Laterality: Right;  [place] 00:28.3 AP% 14.6 CDE 4.12 Fluid pack Lot # [id number]   CATARACT EXTRACTION W/PHACO Left 12/29/2017   Procedure: CATARACT EXTRACTION PHACO AND INTRAOCULAR LENS PLACEMENT ([facility]);  Surgeon: Birder Robson, MD;  Location: [facility] ORS;  Service: Ophthalmology;  Laterality: Left;  [place] 00:30.4 AP% 14.0 CDE 4.25 Fluid Pack Lot # [id number]   CO2 LASER OF [place]     COLONOSCOPY WITH PROPOFOL N/A 02/10/2020   Procedure: COLONOSCOPY WITH PROPOFOL;  Surgeon: [name], MD;  Location: [facility] ENDOSCOPY;  Service: Gastroenterology;  Laterality: N/A;  COVID POSITIVE ON December 24, 2019   discetomy     EYE SURGERY     Cataracts; laser procedure for torn retina   HERNIA REPAIR     X 2   SKIN CANCER DESTRUCTION     SPINE SURGERY     Partial diskectomy, laminectomy L4-L5    Family History  Problem Relation Age of Onset   Cancer Father        prostate   Heart  disease Father    Hypothyroidism Sister    Heart disease Mother        CHF, atrial fibrillation   Hyperlipidemia Mother        high TG   Diabetes Brother    Heart disease Brother    Obesity Brother    Diabetes Paternal Grandmother     Social History   Socioeconomic History   Marital status: Married    Spouse name: Not on file   Number of children: 0   Years of education: Not on file   Highest education level: Master's degree (e.g., MA, MS, MEng, MEd, MSW, MBA)  Occupational History   Not on file  Tobacco Use   Smoking status: Former   Smokeless tobacco: Never   Tobacco comments:    Quit 1985  Vaping Use   Vaping Use: Never used  Substance and Sexual Activity   Alcohol use: Yes    Alcohol/week: 28.0 standard drinks    Types: 28 Glasses of wine per week    Drug use: No   Sexual activity: Yes    Partners: Female    Birth control/protection: None  Other Topics Concern   Not on file  Social History Narrative   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, 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   Social Determinants of Radio broadcast assistant Strain: Low Risk    Difficulty of Paying Living Expenses: Not hard at all  Food Insecurity: No Food Insecurity   Worried About Charity fundraiser in the Last Year: Never true   Arboriculturist in the Last Year: Never true  Transportation Needs: No Transportation Needs   Lack of Transportation (Medical): No   Lack of Transportation (Non-Medical): No  Physical Activity: Sufficiently Active   Days of Exercise per Week: 4 days   Minutes of Exercise per Session: 50 min  Stress: No Stress Concern Present   Feeling of Stress : Not at all  Social Connections: Socially Integrated   Frequency of Communication with Friends and Family: More than three times a week   Frequency of Social Gatherings with Friends and Family: Once a week   Attends Religious Services: More than 4 times per year   Active Member of [employer] or Organizations: Yes   Attends Music therapist: More than 4 times per year   Marital Status: Married  Human resources officer Violence: Not At Risk   Fear of Current or Ex-Partner: No   Emotionally Abused: No   Physically Abused: No   Sexually Abused: No    Outpatient Medications Prior to Visit  Medication Sig Dispense Refill   aspirin EC 81 MG tablet Take 81 mg by mouth 4 (four) times a week.     atorvastatin (LIPITOR) 20 MG tablet Take 1 tablet (20 mg total) by mouth at bedtime. 90 tablet 3   levothyroxine (SYNTHROID) 112 MCG tablet Take 1 tablet (112 mcg total) by mouth daily before breakfast. 90 tablet 3   No facility-administered medications prior to visit.    Allergies  Allergen Reactions   Tetanus  Toxoids Swelling    Fever    Penicillins Swelling and Rash    Has patient had a PCN reaction causing immediate rash, facial/tongue/throat swelling, SOB or lightheadedness with hypotension: yes Has patient had a PCN reaction causing severe rash involving mucus membranes or skin necrosis: no Has patient had a PCN reaction that  required hospitalization: no Has patient had a PCN reaction occurring within the last 10 years: no If all of the above answers are "NO", then may proceed with Cephalosporin use.     ROS Review of Systems  Constitutional:  Negative for chills and fever.  Eyes:  Negative for visual disturbance.  Respiratory:  Negative for cough and shortness of breath.   Cardiovascular:  Negative for chest pain.  Gastrointestinal:  Negative for abdominal pain.     Objective:    Physical Exam Constitutional:      Appearance: Normal appearance.  HENT:     Head: Normocephalic and atraumatic.  Eyes:     Conjunctiva/sclera: Conjunctivae normal.  Cardiovascular:     Rate and Rhythm: Normal rate and regular rhythm.  Pulmonary:     Effort: Pulmonary effort is normal.     Breath sounds: Normal breath sounds.  Musculoskeletal:     Right lower leg: No edema.     Left lower leg: No edema.  Skin:    General: Skin is warm and dry.  Neurological:     General: No focal deficit present.     Mental Status: He is alert. Mental status is at baseline.  Psychiatric:        Mood and Affect: Mood normal.        Behavior: Behavior normal.    BP 114/64    Pulse (!) 55    Temp 98 F (36.7 C)    Resp 16    Ht 6\' 1"  (1.854 m)    Wt 176 lb 1.6 oz (79.9 kg)    SpO2 98%    BMI 23.23 kg/m  Wt Readings from Last 3 Encounters:  11/21/21 176 lb 1.6 oz (79.9 kg)  05/14/21 172 lb (78 kg)  11/20/20 172 lb 8 oz (78.2 kg)     There are no preventive care reminders to display for this patient.  There are no preventive care reminders to display for this patient.  Lab Results  Component Value  Date   TSH 0.29 (L) 11/20/2020   Lab Results  Component Value Date   WBC 3.8 11/20/2020   HGB 15.2 11/20/2020   HCT 44.9 11/20/2020   MCV 100.0 11/20/2020   PLT 211 11/20/2020   Lab Results  Component Value Date   NA 141 11/20/2020   K 4.8 11/20/2020   CO2 30 11/20/2020   GLUCOSE 101 (H) 11/20/2020   BUN 20 11/20/2020   CREATININE 0.79 11/20/2020   BILITOT 0.8 11/20/2020   ALKPHOS 87 11/11/2016   AST 25 11/20/2020   ALT 30 11/20/2020   PROT 7.0 11/20/2020   ALBUMIN 4.1 11/11/2016   CALCIUM 9.4 11/20/2020   ANIONGAP 4 (L) 11/06/2014   Lab Results  Component Value Date   CHOL 148 11/20/2020   Lab Results  Component Value Date   HDL 75 11/20/2020   Lab Results  Component Value Date   LDLCALC 60 11/20/2020   Lab Results  Component Value Date   TRIG 44 11/20/2020   Lab Results  Component Value Date   CHOLHDL 2.0 11/20/2020   Lab Results  Component Value Date   HGBA1C 5.4 11/20/2020      Assessment & Plan:   Problem List Items Addressed This Visit       Endocrine   Hypothyroidism due to acquired atrophy of thyroid - Primary    Stable on Levothyroxine 112, recheck levels today and refilled.       Relevant Medications  levothyroxine (SYNTHROID) 112 MCG tablet   Other Relevant Orders   TSH     Other   Hyperlipidemia    Stable, recheck lipid panel today, refilled statin.      Relevant Medications   atorvastatin (LIPITOR) 20 MG tablet   Other Relevant Orders   Lipid Profile   Prediabetes    Stable, recheck A1c today.      Relevant Orders   CBC w/Diff/Platelet   COMPLETE METABOLIC PANEL WITH GFR   HgB A1c   Other Visit Diagnoses     Screening for prostate cancer       Relevant Orders   PSA   Need for pneumococcal vaccine       Relevant Orders   Pneumococcal conjugate vaccine 20-valent (Prevnar 20) (Completed)   Encounter for medication monitoring       Relevant Medications   atorvastatin (LIPITOR) 20 MG tablet       Follow-up:  Return in about 1 year (around 11/21/2022).    Teodora Medici, DO

## 2021-11-21 NOTE — Assessment & Plan Note (Signed)
Stable, recheck A1c today.

## 2021-11-21 NOTE — Patient Instructions (Signed)
Marc Jackson , Thank you for taking time to come for your Medicare Wellness Visit. I appreciate your ongoing commitment to your health goals. Please review the following plan we discussed and let me know if I can assist you in the future.   Screening recommendations/referrals: Colonoscopy: no longer required Recommended yearly ophthalmology/optometry visit for glaucoma screening and checkup Recommended yearly dental visit for hygiene and checkup  Vaccinations: Influenza vaccine: done 08/02/21 Pneumococcal vaccine: done 09/14/14 Tdap vaccine: n/a Shingles vaccine: done 05/16/18 & 09/04/18   Covid-19: done 12/03/19, 01/03/20, 07/10/20, 01/15/21 & 08/02/21  Conditions/risks identified: Keep up the great work!  Next appointment: Follow up in one year for your annual wellness visit.   Preventive Care 51 Years and Older, Male Preventive care refers to lifestyle choices and visits with your health care provider that can promote health and wellness. What does preventive care include? A yearly physical exam. This is also called an annual well check. Dental exams once or twice a year. Routine eye exams. Ask your health care provider how often you should have your eyes checked. Personal lifestyle choices, including: Daily care of your teeth and gums. Regular physical activity. Eating a healthy diet. Avoiding tobacco and drug use. Limiting alcohol use. Practicing safe sex. Taking low doses of aspirin every day. Taking vitamin and mineral supplements as recommended by your health care provider. What happens during an annual well check? The services and screenings done by your health care provider during your annual well check will depend on your age, overall health, lifestyle risk factors, and family history of disease. Counseling  Your health care provider may ask you questions about your: Alcohol use. Tobacco use. Drug use. Emotional well-being. Home and relationship well-being. Sexual  activity. Eating habits. History of falls. Memory and ability to understand (cognition). Work and work Statistician. Screening  You may have the following tests or measurements: Height, weight, and BMI. Blood pressure. Lipid and cholesterol levels. These may be checked every 5 years, or more frequently if you are over 78 years old. Skin check. Lung cancer screening. You may have this screening every year starting at age 25 if you have a 30-pack-year history of smoking and currently smoke or have quit within the past 15 years. Fecal occult blood test (FOBT) of the stool. You may have this test every year starting at age 22. Flexible sigmoidoscopy or colonoscopy. You may have a sigmoidoscopy every 5 years or a colonoscopy every 10 years starting at age 71. Prostate cancer screening. Recommendations will vary depending on your family history and other risks. Hepatitis C blood test. Hepatitis B blood test. Sexually transmitted disease (STD) testing. Diabetes screening. This is done by checking your blood sugar (glucose) after you have not eaten for a while (fasting). You may have this done every 1-3 years. Abdominal aortic aneurysm (AAA) screening. You may need this if you are a current or former smoker. Osteoporosis. You may be screened starting at age 54 if you are at high risk. Talk with your health care provider about your test results, treatment options, and if necessary, the need for more tests. Vaccines  Your health care provider may recommend certain vaccines, such as: Influenza vaccine. This is recommended every year. Tetanus, diphtheria, and acellular pertussis (Tdap, Td) vaccine. You may need a Td booster every 10 years. Zoster vaccine. You may need this after age 78. Pneumococcal 13-valent conjugate (PCV13) vaccine. One dose is recommended after age 10. Pneumococcal polysaccharide (PPSV23) vaccine. One dose is recommended after age  7. Talk to your health care provider about which  screenings and vaccines you need and how often you need them. This information is not intended to replace advice given to you by your health care provider. Make sure you discuss any questions you have with your health care provider. Document Released: 10/26/2015 Document Revised: 06/18/2016 Document Reviewed: 07/31/2015 Elsevier Interactive Patient Education  2017 [place] Prevention in the Home Falls can cause injuries. They can happen to people of all ages. There are many things you can do to make your home safe and to help prevent falls. What can I do on the outside of my home? Regularly fix the edges of walkways and driveways and fix any cracks. Remove anything that might make you trip as you walk through a door, such as a raised step or threshold. Trim any bushes or trees on the path to your home. Use bright outdoor lighting. Clear any walking paths of anything that might make someone trip, such as rocks or tools. Regularly check to see if handrails are loose or broken. Make sure that both sides of any steps have handrails. Any raised decks and porches should have guardrails on the edges. Have any leaves, snow, or ice cleared regularly. Use sand or salt on walking paths during winter. Clean up any spills in your garage right away. This includes oil or grease spills. What can I do in the bathroom? Use night lights. Install grab bars by the toilet and in the tub and shower. Do not use towel bars as grab bars. Use non-skid mats or decals in the tub or shower. If you need to sit down in the shower, use a plastic, non-slip stool. Keep the floor dry. Clean up any water that spills on the floor as soon as it happens. Remove soap buildup in the tub or shower regularly. Attach bath mats securely with double-sided non-slip rug tape. Do not have throw rugs and other things on the floor that can make you trip. What can I do in the bedroom? Use night lights. Make sure that you have a  light by your bed that is easy to reach. Do not use any sheets or blankets that are too big for your bed. They should not hang down onto the floor. Have a firm chair that has side arms. You can use this for support while you get dressed. Do not have throw rugs and other things on the floor that can make you trip. What can I do in the kitchen? Clean up any spills right away. Avoid walking on wet floors. Keep items that you use a lot in easy-to-reach places. If you need to reach something above you, use a strong step stool that has a grab bar. Keep electrical cords out of the way. Do not use floor polish or wax that makes floors slippery. If you must use wax, use non-skid floor wax. Do not have throw rugs and other things on the floor that can make you trip. What can I do with my stairs? Do not leave any items on the stairs. Make sure that there are handrails on both sides of the stairs and use them. Fix handrails that are broken or loose. Make sure that handrails are as long as the stairways. Check any carpeting to make sure that it is firmly attached to the stairs. Fix any carpet that is loose or worn. Avoid having throw rugs at the top or bottom of the stairs. If you do have  throw rugs, attach them to the floor with carpet tape. Make sure that you have a light switch at the top of the stairs and the bottom of the stairs. If you do not have them, ask someone to add them for you. What else can I do to help prevent falls? Wear shoes that: Do not have high heels. Have rubber bottoms. Are comfortable and fit you well. Are closed at the toe. Do not wear sandals. If you use a stepladder: Make sure that it is fully opened. Do not climb a closed stepladder. Make sure that both sides of the stepladder are locked into place. Ask someone to hold it for you, if possible. Clearly mark and make sure that you can see: Any grab bars or handrails. First and last steps. Where the edge of each step  is. Use tools that help you move around (mobility aids) if they are needed. These include: Canes. Walkers. Scooters. Crutches. Turn on the lights when you go into a dark area. Replace any light bulbs as soon as they burn out. Set up your furniture so you have a clear path. Avoid moving your furniture around. If any of your floors are uneven, fix them. If there are any pets around you, be aware of where they are. Review your medicines with your doctor. Some medicines can make you feel dizzy. This can increase your chance of falling. Ask your doctor what other things that you can do to help prevent falls. This information is not intended to replace advice given to you by your health care provider. Make sure you discuss any questions you have with your health care provider. Document Released: 07/26/2009 Document Revised: 03/06/2016 Document Reviewed: 11/03/2014 Elsevier Interactive Patient Education  2017 [employer].

## 2021-11-21 NOTE — Assessment & Plan Note (Signed)
Stable, recheck lipid panel today, refilled statin.

## 2021-11-21 NOTE — Progress Notes (Signed)
Subjective:   Marc Jackson is a 76 y.o. male who presents for Medicare Annual/Subsequent preventive examination.  Review of Systems     Cardiac Risk Factors include: advanced age (>60men, >35 women);dyslipidemia;male gender     Objective:    Today's Vitals   11/21/21 0854  BP: 114/64  Pulse: (!) 55  Resp: 16  Temp: 98 F (36.7 C)  TempSrc: Oral  SpO2: 98%  Weight: 176 lb 1.6 oz (79.9 kg)  Height: 6\' 1"  (1.854 m)   Body mass index is 23.23 kg/m.  Advanced Directives 11/21/2021 11/20/2020 02/10/2020 11/17/2019 11/12/2018 10/06/2018 11/24/2017  Does Patient Have a Medical Advance Directive? Yes Yes Yes Yes Yes No Yes  Type of Paramedic of [place];Living will [place];Living will Living will [place];Living will [place];Living will - [place];Living will  Copy of [place] in Chart? Yes - validated most recent copy scanned in chart (See row information) No - copy requested - No - copy requested No - copy requested - No - copy requested    Current Medications (verified) Outpatient Encounter Medications as of 11/21/2021  Medication Sig   aspirin EC 81 MG tablet Take 81 mg by mouth 4 (four) times a week.   atorvastatin (LIPITOR) 20 MG tablet Take 1 tablet (20 mg total) by mouth at bedtime.   levothyroxine (SYNTHROID) 112 MCG tablet Take 1 tablet (112 mcg total) by mouth daily before breakfast.   No facility-administered encounter medications on file as of 11/21/2021.    Allergies (verified) Tetanus toxoids and Penicillins   History: Past Medical History:  Diagnosis Date   Alcohol use 11/23/2016   Allergic rhinitis with postnasal drip 11/09/2015   Allergy    Penicillin, Tetnus Toxoid   Annual physical exam 11/09/2015   Cancer [facility])    SKIN   Cataract    Hyperglycemia 11/10/2017   Hyperlipidemia    Hypothyroidism    IBS (irritable bowel syndrome)     Thyroid disease    Past Surgical History:  Procedure Laterality Date   BACK SURGERY     CATARACT EXTRACTION W/PHACO Right 11/24/2017   Procedure: CATARACT EXTRACTION PHACO AND INTRAOCULAR LENS PLACEMENT ([place]);  Surgeon: Birder Robson, MD;  Location: [facility] ORS;  Service: Ophthalmology;  Laterality: Right;  [place] 00:28.3 AP% 14.6 CDE 4.12 Fluid pack Lot # [id number]   CATARACT EXTRACTION W/PHACO Left 12/29/2017   Procedure: CATARACT EXTRACTION PHACO AND INTRAOCULAR LENS PLACEMENT ([facility]);  Surgeon: Birder Robson, MD;  Location: [facility] ORS;  Service: Ophthalmology;  Laterality: Left;  [place] 00:30.4 AP% 14.0 CDE 4.25 Fluid Pack Lot # [id number]   CO2 LASER OF [place]     COLONOSCOPY WITH PROPOFOL N/A 02/10/2020   Procedure: COLONOSCOPY WITH PROPOFOL;  Surgeon: [name], MD;  Location: [facility] ENDOSCOPY;  Service: Gastroenterology;  Laterality: N/A;  COVID POSITIVE ON December 24, 2019   discetomy     EYE SURGERY     Cataracts; laser procedure for torn retina   HERNIA REPAIR     X 2   SKIN CANCER DESTRUCTION     SPINE SURGERY     Partial diskectomy, laminectomy L4-L5   Family History  Problem Relation Age of Onset   Cancer Father        prostate   Heart disease Father    Hypothyroidism Sister    Heart disease Mother        CHF, atrial fibrillation   Hyperlipidemia  Mother        high TG   Diabetes Brother    Heart disease Brother    Obesity Brother    Diabetes Paternal Grandmother    Social History   Socioeconomic History   Marital status: Married    Spouse name: Not on file   Number of children: 0   Years of education: Not on file   Highest education level: Master's degree (e.g., MA, MS, MEng, MEd, MSW, MBA)  Occupational History   Not on file  Tobacco Use   Smoking status: Former   Smokeless tobacco: Never   Tobacco comments:    Quit 1985  Vaping Use   Vaping Use: Never used  Substance and Sexual Activity   Alcohol use: Yes    Alcohol/week: 28.0 standard drinks     Types: 28 Glasses of wine per week   Drug use: No   Sexual activity: Yes    Partners: Female    Birth control/protection: None  Other Topics Concern   Not on file  Social History Narrative   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, 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   Social Determinants of Radio broadcast assistant Strain: Low Risk    Difficulty of Paying Living Expenses: Not hard at all  Food Insecurity: No Food Insecurity   Worried About Charity fundraiser in the Last Year: Never true   Arboriculturist in the Last Year: Never true  Transportation Needs: No Transportation Needs   Lack of Transportation (Medical): No   Lack of Transportation (Non-Medical): No  Physical Activity: Sufficiently Active   Days of Exercise per Week: 4 days   Minutes of Exercise per Session: 50 min  Stress: No Stress Concern Present   Feeling of Stress : Not at all  Social Connections: Socially Integrated   Frequency of Communication with Friends and Family: More than three times a week   Frequency of Social Gatherings with Friends and Family: Once a week   Attends Religious Services: More than 4 times per year   Active Member of [employer] or Organizations: Yes   Attends Music therapist: More than 4 times per year   Marital Status: Married    Tobacco Counseling Counseling given: Not Answered Tobacco comments: Quit 1985   Clinical Intake:  Pre-visit preparation completed: Yes  Pain : No/denies pain     BMI - recorded: 23.23 Nutritional Status: BMI of 19-24  Normal Nutritional Risks: None Diabetes: No  How often do you need to have someone help you when you read instructions, pamphlets, or other written materials from your doctor or pharmacy?: 1 - Never    Interpreter Needed?: No  Information entered by :: Clemetine Marker LPN   Activities of Daily Living In your present state of health, do you have  any difficulty performing the following activities: 11/21/2021  Hearing? N  Vision? N  Difficulty concentrating or making decisions? N  Walking or climbing stairs? N  Dressing or bathing? N  Doing errands, shopping? N  Preparing Food and eating ? N  Using the Toilet? N  In the past six months, have you accidently leaked urine? N  Do you have problems with loss of bowel control? N  Managing your Medications? N  Managing your Finances? N  Housekeeping or managing your Housekeeping? N  Some recent data might be hidden    Patient Care  Team: Teodora Medici, DO as PCP - General (Internal Medicine) Dasher, Rayvon Char, MD (Dermatology)  Indicate any recent Medical Services you may have received from other than [facility] providers in the past year (date may be approximate).     Assessment:   This is a routine wellness examination for [place].  Hearing/Vision screen Hearing Screening - Comments:: Pt denies hearing difficulty Vision Screening - Comments:: Annual vision screenings done at [facility] Dr. George Ina  Dietary issues and exercise activities discussed: Current Exercise Habits: Home exercise routine, Type of exercise: walking;Other - see comments (cycling), Time (Minutes): 50, Frequency (Times/Week): 4, Weekly Exercise (Minutes/Week): 200, Intensity: Moderate, Exercise limited by: None identified   Goals Addressed             This Visit's Progress    Increase physical activity   On track    Recommend exercising 150 minutes per week       Depression Screen PHQ 2/9 Scores 11/21/2021 11/20/2020 11/20/2020 11/17/2019 11/17/2019 11/12/2018 11/10/2017  PHQ - 2 Score 0 0 0 0 0 0 0  PHQ- 9 Score - 0 - - 0 - -    Fall Risk Fall Risk  11/21/2021 11/20/2020 11/20/2020 11/17/2019 11/17/2019  Falls in the past year? 1 0 0 0 0  Number falls in past yr: 0 0 0 0 0  Injury with Fall? 0 0 0 0 0  Risk for fall due to : No Fall Risks - No Fall Risks No Fall Risks -  Follow up Falls prevention  discussed - - Falls prevention discussed -    FALL RISK PREVENTION PERTAINING TO THE HOME:  Any stairs in or around the home? Yes  If so, are there any without handrails? No  Home free of loose throw rugs in walkways, pet beds, electrical cords, etc? Yes  Adequate lighting in your home to reduce risk of falls? Yes   ASSISTIVE DEVICES UTILIZED TO PREVENT FALLS:  Life alert? No  Use of a cane, walker or w/c? No  Grab bars in the bathroom? No Shower chair or bench in shower? No  Elevated toilet seat or a handicapped toilet? No   TIMED UP AND GO:  Was the test performed? Yes .  Length of time to ambulate 10 feet: 5 sec.   Gait steady and fast without use of assistive device  Cognitive Function:Normal cognitive status assessed by direct observation by this Nurse Health Advisor. No abnormalities found.       6CIT Screen 11/12/2018 11/10/2017  What Year? 0 points 0 points  What month? 0 points 0 points  What time? 0 points 0 points  Count back from 20 0 points 0 points  Months in reverse 0 points 0 points  Repeat phrase 0 points 0 points  Total Score 0 0    Immunizations Immunization History  Administered Date(s) Administered   Fluad Quad(high Dose 65+) 07/19/2019, 08/16/2020   Influenza, High Dose Seasonal PF 08/24/2018, 08/02/2021   Influenza-Unspecified 08/01/2015, 07/30/2016, 07/30/2017   PFIZER(Purple Top)SARS-COV-2 Vaccination 12/03/2019, 01/03/2020, 07/10/2020, 01/15/2021   Pfizer Covid-19 Vaccine Bivalent Booster 82yrs & up 08/02/2021   Pneumococcal Conjugate-13 09/14/2014   Pneumococcal Polysaccharide-23 08/08/2013   Zoster Recombinat (Shingrix) 05/16/2018, 09/04/2018   Zoster, Live 07/13/2010    Tdap status: n/a due to allergy  Flu Vaccine status: Up to date  Pneumococcal vaccine status: Up to date  Covid-19 vaccine status: Completed vaccines  Qualifies for Shingles Vaccine? Yes   Zostavax completed Yes   Shingrix Completed?: Yes  Screening  Tests Health Maintenance  Topic Date Due   COLONOSCOPY (Pts 45-49yrs Insurance coverage will need to be confirmed)  02/09/2030   Pneumonia Vaccine 23+ Years old  Completed   INFLUENZA VACCINE  Completed   COVID-19 Vaccine  Completed   Hepatitis C Screening  Completed   Zoster Vaccines- Shingrix  Completed   HPV VACCINES  Aged Out   TETANUS/TDAP  Discontinued    Health Maintenance  There are no preventive care reminders to display for this patient.   Colorectal cancer screening: No longer required.   Lung Cancer Screening: (Low Dose CT Chest recommended if Age 48-80 years, 30 pack-year currently smoking OR have quit w/in 15years.) does not qualify.   Additional Screening:  Hepatitis C Screening: does qualify; Completed 11/09/15  Vision Screening: Recommended annual ophthalmology exams for early detection of glaucoma and other disorders of the eye. Is the patient up to date with their annual eye exam?  Yes  Who is the provider or what is the name of the office in which the patient attends annual eye exams? [facility].   Dental Screening: Recommended annual dental exams for proper oral hygiene  Community Resource Referral / Chronic Care Management: CRR required this visit?  No   CCM required this visit?  No      Plan:     I have personally reviewed and noted the following in the patients chart:   Medical and social history Use of alcohol, tobacco or illicit drugs  Current medications and supplements including opioid prescriptions. Patient is not currently taking opioid prescriptions. Functional ability and status Nutritional status Physical activity Advanced directives List of other physicians Hospitalizations, surgeries, and ER visits in previous 12 months Vitals Screenings to include cognitive, depression, and falls Referrals and appointments  In addition, I have reviewed and discussed with patient certain preventive protocols, quality metrics, and  best practice recommendations. A written personalized care plan for preventive services as well as general preventive health recommendations were provided to patient.     Clemetine Marker, LPN   1/0/3013   Nurse Notes: none

## 2021-11-21 NOTE — Patient Instructions (Signed)
It was great seeing you today!  Plan discussed at today's visit: -Blood work ordered today, results will be uploaded to [facility]. -Medications refilled  -Prevnar 20 vaccine given today for pneumonia    Follow up in: 1 year  Take care and let us know if you have any questions or concerns prior to your next visit.  Dr. Rosana Berger

## 2021-11-22 LAB — CBC WITH DIFFERENTIAL/PLATELET
Absolute Monocytes: 361 cells/uL (ref 200–950)
Basophils Absolute: 39 cells/uL (ref 0–200)
Basophils Relative: 1.1 %
Eosinophils Absolute: 333 cells/uL (ref 15–500)
Eosinophils Relative: 9.5 %
HCT: 43.2 % (ref 38.5–50.0)
Hemoglobin: 14.4 g/dL (ref 13.2–17.1)
Lymphs Abs: 1215 cells/uL (ref 850–3900)
MCH: 33.3 pg — ABNORMAL HIGH (ref 27.0–33.0)
MCHC: 33.3 g/dL (ref 32.0–36.0)
MCV: 99.8 fL (ref 80.0–100.0)
MPV: 10.1 fL (ref 7.5–12.5)
Monocytes Relative: 10.3 %
Neutro Abs: 1554 cells/uL (ref 1500–7800)
Neutrophils Relative %: 44.4 %
Platelets: 197 10*3/uL (ref 140–400)
RBC: 4.33 10*6/uL (ref 4.20–5.80)
RDW: 12.6 % (ref 11.0–15.0)
Total Lymphocyte: 34.7 %
WBC: 3.5 10*3/uL — ABNORMAL LOW (ref 3.8–10.8)

## 2021-11-22 LAB — COMPLETE METABOLIC PANEL WITH GFR
AG Ratio: 1.7 (calc) (ref 1.0–2.5)
ALT: 26 U/L (ref 9–46)
AST: 25 U/L (ref 10–35)
Albumin: 4.3 g/dL (ref 3.6–5.1)
Alkaline phosphatase (APISO): 86 U/L (ref 35–144)
BUN: 16 mg/dL (ref 7–25)
CO2: 30 mmol/L (ref 20–32)
Calcium: 9.3 mg/dL (ref 8.6–10.3)
Chloride: 105 mmol/L (ref 98–110)
Creat: 0.9 mg/dL (ref 0.70–1.28)
Globulin: 2.5 g/dL (calc) (ref 1.9–3.7)
Glucose, Bld: 95 mg/dL (ref 65–99)
Potassium: 4.9 mmol/L (ref 3.5–5.3)
Sodium: 140 mmol/L (ref 135–146)
Total Bilirubin: 0.9 mg/dL (ref 0.2–1.2)
Total Protein: 6.8 g/dL (ref 6.1–8.1)
eGFR: 90 mL/min/{1.73_m2} (ref >=60)

## 2021-11-22 LAB — TSH: TSH: 0.72 mIU/L (ref 0.40–4.50)

## 2021-11-22 LAB — PSA: PSA: 0.59 ng/mL (ref <=4.00)

## 2021-11-22 LAB — HEMOGLOBIN A1C
Hgb A1c MFr Bld: 5.5 % of total Hgb (ref <5.7)
Mean Plasma Glucose: 111 mg/dL
eAG (mmol/L): 6.2 mmol/L

## 2021-11-22 LAB — LIPID PANEL
Cholesterol: 128 mg/dL (ref <200)
HDL: 58 mg/dL (ref >=40)
LDL Cholesterol (Calc): 56 mg/dL (calc)
Non-HDL Cholesterol (Calc): 70 mg/dL (calc) (ref <130)
Total CHOL/HDL Ratio: 2.2 (calc) (ref <5.0)
Triglycerides: 55 mg/dL (ref <150)

## 2022-08-14 DIAGNOSIS — Z23 Encounter for immunization: Secondary | ICD-10-CM | POA: Diagnosis not present

## 2022-08-28 DIAGNOSIS — H43813 Vitreous degeneration, bilateral: Secondary | ICD-10-CM | POA: Diagnosis not present

## 2022-09-09 DIAGNOSIS — D2261 Melanocytic nevi of right upper limb, including shoulder: Secondary | ICD-10-CM | POA: Diagnosis not present

## 2022-09-09 DIAGNOSIS — D2262 Melanocytic nevi of left upper limb, including shoulder: Secondary | ICD-10-CM | POA: Diagnosis not present

## 2022-09-09 DIAGNOSIS — Z85828 Personal history of other malignant neoplasm of skin: Secondary | ICD-10-CM | POA: Diagnosis not present

## 2022-09-09 DIAGNOSIS — D225 Melanocytic nevi of trunk: Secondary | ICD-10-CM | POA: Diagnosis not present

## 2022-09-22 DIAGNOSIS — H43813 Vitreous degeneration, bilateral: Secondary | ICD-10-CM | POA: Diagnosis not present

## 2022-09-23 ENCOUNTER — Emergency Department: Payer: Medicare Other

## 2022-09-23 ENCOUNTER — Other Ambulatory Visit: Payer: Self-pay

## 2022-09-23 ENCOUNTER — Emergency Department
Admission: EM | Admit: 2022-09-23 | Discharge: 2022-09-23 | Disposition: A | Discharge status: Home / Self Care | Payer: Medicare Other | Attending: Emergency Medicine | Admitting: Emergency Medicine

## 2022-09-23 DIAGNOSIS — R079 Chest pain, unspecified: Secondary | ICD-10-CM

## 2022-09-23 DIAGNOSIS — E039 Hypothyroidism, unspecified: Secondary | ICD-10-CM | POA: Diagnosis not present

## 2022-09-23 DIAGNOSIS — J439 Emphysema, unspecified: Secondary | ICD-10-CM | POA: Diagnosis not present

## 2022-09-23 DIAGNOSIS — R0789 Other chest pain: Secondary | ICD-10-CM | POA: Insufficient documentation

## 2022-09-23 LAB — CBC WITH DIFFERENTIAL/PLATELET
Abs Immature Granulocytes: 0.01 10*3/uL (ref 0.00–0.07)
Basophils Absolute: 0 10*3/uL (ref 0.0–0.1)
Basophils Relative: 1 %
Eosinophils Absolute: 0.2 10*3/uL (ref 0.0–0.5)
Eosinophils Relative: 4 %
HCT: 43.1 % (ref 39.0–52.0)
Hemoglobin: 14.1 g/dL (ref 13.0–17.0)
Immature Granulocytes: 0 %
Lymphocytes Relative: 28 %
Lymphs Abs: 1.2 10*3/uL (ref 0.7–4.0)
MCH: 32.9 pg (ref 26.0–34.0)
MCHC: 32.7 g/dL (ref 30.0–36.0)
MCV: 100.5 fL — ABNORMAL HIGH (ref 80.0–100.0)
Monocytes Absolute: 0.4 10*3/uL (ref 0.1–1.0)
Monocytes Relative: 8 %
Neutro Abs: 2.5 10*3/uL (ref 1.7–7.7)
Neutrophils Relative %: 59 %
Platelets: 186 10*3/uL (ref 150–400)
RBC: 4.29 MIL/uL (ref 4.22–5.81)
RDW: 13.6 % (ref 11.5–15.5)
WBC: 4.3 10*3/uL (ref 4.0–10.5)
nRBC: 0 % (ref 0.0–0.2)

## 2022-09-23 LAB — TSH: TSH: 1.234 u[IU]/mL (ref 0.350–4.500)

## 2022-09-23 LAB — COMPREHENSIVE METABOLIC PANEL
ALT: 35 U/L (ref 0–44)
AST: 35 U/L (ref 15–41)
Albumin: 4.3 g/dL (ref 3.5–5.0)
Alkaline Phosphatase: 100 U/L (ref 38–126)
Anion gap: 8 (ref 5–15)
BUN: 17 mg/dL (ref 8–23)
CO2: 28 mmol/L (ref 22–32)
Calcium: 8.9 mg/dL (ref 8.9–10.3)
Chloride: 105 mmol/L (ref 98–111)
Creatinine, Ser: 0.96 mg/dL (ref 0.61–1.24)
GFR, Estimated: >60 mL/min (ref >60)
Glucose, Bld: 99 mg/dL (ref 70–99)
Potassium: 4 mmol/L (ref 3.5–5.1)
Sodium: 141 mmol/L (ref 135–145)
Total Bilirubin: 1.2 mg/dL (ref 0.3–1.2)
Total Protein: 7.2 g/dL (ref 6.5–8.1)

## 2022-09-23 LAB — T4, FREE: Free T4: 0.7 ng/dL (ref 0.61–1.12)

## 2022-09-23 LAB — TROPONIN I (HIGH SENSITIVITY): Troponin I (High Sensitivity): 6 ng/L (ref <18)

## 2022-09-23 NOTE — ED Provider Triage Note (Signed)
Emergency Medicine Provider Triage Evaluation Note  Marc Jackson , a 76 y.o. male  was evaluated in triage.  Pt complains of right-sided chest pain that is pleuritic in nature.  Patient reports that his chest pain started after he got off his stationary bike.  Patient reports that he pedals approximately 10 miles every day.  He denies associated shortness of breath, nausea or vomiting.  No jaw pain or arm pain.  Denies smoking since 1985.  Denies a history of cardiac issues.  States he is never experienced similar pain in the past.  Review of Systems  Positive: Patient has chest pain.  Negative: No vomiting or abdominal pain.   Physical Exam  There were no vitals taken for this visit. Gen:   Awake, no distress   Resp:  Normal effort  MSK:   Moves extremities without difficulty  Other:    Medical Decision Making  Medically screening exam initiated at 4:32 PM.  Appropriate orders placed.  Carlena Hurl was informed that the remainder of the evaluation will be completed by another provider, this initial triage assessment does not replace that evaluation, and the importance of remaining in the ED until their evaluation is complete.     Vallarie Mare [place], [place] 09/23/22 1635

## 2022-09-23 NOTE — ED Provider Notes (Signed)
[facility] Provider Note   Event Date/Time   First MD Initiated Contact with Patient 09/23/22 1638     (approximate) History  Chest Pain  HPI CHADRIC Marc Jackson is a 76 y.o. male with a stated past medical history of hypothyroidism and hyperlipidemia as well as prediabetes who presents for an episode of left upper chest wall pain that began after a 30-minute bike ride at approximately 1530 today.  Patient states that this pain initially started when he took a big deep breath after riding and it immediately improved after not taking big deep breaths.  Patient states that he can now feel it when he turns to his right side and denies any other exacerbating symptoms.  Patient denies any exertional worsening of these pain.  Patient denies any palpitations, lightheadedness, loss of consciousness, or diaphoresis ROS: Patient currently denies any vision changes, tinnitus, difficulty speaking, facial droop, sore throat, shortness of breath, abdominal pain, nausea/vomiting/diarrhea, dysuria, or weakness/numbness/paresthesias in any extremity   Physical Exam  Triage Vital Signs: ED Triage Vitals  Enc Vitals Group     BP 09/23/22 1637 127/70     Pulse Rate 09/23/22 1637 71     Resp 09/23/22 1637 16     Temp 09/23/22 1637 97.9 F (36.6 C)     Temp Source 09/23/22 1637 Oral     SpO2 09/23/22 1637 97 %     Weight 09/23/22 1635 176 lb (79.8 kg)     Height 09/23/22 1635 '6\' 1"'$  (1.854 m)     Head Circumference --      Peak Flow --      Pain Score 09/23/22 1635 1     Pain Loc --      Pain Edu? --      Excl. in [place]? --    Most recent vital signs: Vitals:   09/23/22 1637 09/23/22 1822  BP: 127/70 120/70  Pulse: 71 70  Resp: 16 16  Temp: 97.9 F (36.6 C) 98 F (36.7 C)  SpO2: 97% 98%   General: Awake, oriented x4. CV:  Good peripheral perfusion.  No murmurs/gallops/rubs. Resp:  Normal effort.  Clear to auscultation bilaterally Abd:  No distention.  Other:  Elderly  Caucasian male laying in bed in no acute distress ED Results / Procedures / Treatments  Labs (all labs ordered are listed, but only abnormal results are displayed) Labs Reviewed  CBC WITH DIFFERENTIAL/PLATELET - Abnormal; Notable for the following components:      Result Value   MCV 100.5 (*)    All other components within normal limits  COMPREHENSIVE METABOLIC PANEL  T4, FREE  TSH  D-DIMER, QUANTITATIVE (NOT AT [facility])  TROPONIN I (HIGH SENSITIVITY)  TROPONIN I (HIGH SENSITIVITY)   EKG ED ECG REPORT I, Naaman Plummer, the attending physician, personally viewed and interpreted this ECG. Date: 09/23/2022 EKG Time: 1631 Rate: 71 Rhythm: normal sinus rhythm QRS Axis: normal Intervals: normal ST/T Wave abnormalities: normal Narrative Interpretation: no evidence of acute ischemia RADIOLOGY ED MD interpretation: One-view portable chest x-ray interpreted by me shows no evidence of acute abnormalities including no pneumonia, pneumothorax, or widened mediastinum -Agree with radiology assessment Official radiology report(s): DG Chest 2 View  Result Date: 09/23/2022 CLINICAL DATA:  Chest pain since 1 p.m. EXAM: CHEST - 2 VIEW COMPARISON:  02/14/2015 FINDINGS: Frontal and lateral views of the chest demonstrate a stable cardiac silhouette. Stable emphysema. No airspace disease, effusion, or pneumothorax. No acute displaced fracture. IMPRESSION: 1. Stable background  emphysema.  No acute airspace disease. Electronically Signed   By: Randa Ngo M.D.   On: 09/23/2022 17:22   PROCEDURES: Critical Care performed: No .1-3 Lead EKG Interpretation  Performed by: Naaman Plummer, MD Authorized by: Naaman Plummer, MD    MEDICATIONS ORDERED IN ED: Medications - No data to display IMPRESSION / MDM / [place] / ED COURSE  I reviewed the triage vital signs and the nursing notes.                             The patient is on the cardiac monitor to evaluate for evidence of arrhythmia  and/or significant heart rate changes. Patient's presentation is most consistent with acute presentation with potential threat to life or bodily function. Patient is a 76 year old male with the above-stated past medical history who presents for left-sided anterior chest wall pain that is worsened with palpation or movement and began after a 30-minute bike ride Workup: ECG, CXR, CBC, CMP, Troponin Findings: ECG: No overt evidence of STEMI. No evidence of Brugadas sign, delta wave, epsilon wave, significantly prolonged QTc, or malignant arrhythmia HS Troponin: Negative x1 Other Labs unremarkable for emergent problems. CXR: Without PTX, PNA, or widened mediastinum CT chest with calcium scoring in 2019 that had a calcium score of 0 HEART Score: 3  Given History, Exam, and Workup I have low suspicion for ACS, Pneumothorax, Pneumonia, Pulmonary Embolus, Tamponade, Aortic Dissection or other emergent problem as a cause for this presentation.   Reassesment: Prior to discharge patients pain was controlled and they were well appearing.  Disposition:  Discharge. Strict return precautions discussed with patient with full understanding. Advised patient to follow up promptly with primary care provider     FINAL CLINICAL IMPRESSION(S) / ED DIAGNOSES   Final diagnoses:  Left-sided chest pain   Rx / DC Orders   ED Discharge Orders          Ordered    Ambulatory referral to Cardiology       Comments: If you have not heard from the Cardiology office within the next 72 hours please call [phone].   09/23/22 1832           Note:  This document was prepared using Dragon voice recognition software and may include unintentional dictation errors.   Naaman Plummer, MD 09/23/22 [phone]

## 2022-09-23 NOTE — ED Triage Notes (Signed)
Reports started having chest pain around 1pm after riding his bike which is a regular thing he does.  Denies sob, dizziness, or pain radiating anywhere.

## 2022-10-09 DIAGNOSIS — H02831 Dermatochalasis of right upper eyelid: Secondary | ICD-10-CM | POA: Diagnosis not present

## 2022-11-15 ENCOUNTER — Other Ambulatory Visit: Payer: Self-pay | Admitting: Internal Medicine

## 2022-11-15 DIAGNOSIS — E782 Mixed hyperlipidemia: Secondary | ICD-10-CM

## 2022-11-15 DIAGNOSIS — Z5181 Encounter for therapeutic drug level monitoring: Secondary | ICD-10-CM

## 2022-11-15 DIAGNOSIS — E034 Atrophy of thyroid (acquired): Secondary | ICD-10-CM

## 2022-11-17 NOTE — Telephone Encounter (Signed)
Future visit in 1 week.  Requested Prescriptions  Pending Prescriptions Disp Refills   atorvastatin (LIPITOR) 20 MG tablet [Pharmacy Med Name: ATORVASTATIN 20 MG TABLET] 90 tablet 0    Sig: TAKE ONE TABLET BY MOUTH EVERY NIGHT AT BEDTIME     Cardiovascular:  Antilipid - Statins Failed - 11/15/2022 11:56 AM      Failed - Lipid Panel in normal range within the last 12 months    Cholesterol, Total  Date Value Ref Range Status  11/09/2015 217 (H) 100 - 199 mg/dL Final   Cholesterol  Date Value Ref Range Status  11/21/2021 128 <200 mg/dL Final   LDL Cholesterol (Calc)  Date Value Ref Range Status  11/21/2021 56 mg/dL (calc) Final    Comment:    Reference range: <100 . Desirable range <100 mg/dL for primary prevention;   <70 mg/dL for patients with CHD or diabetic patients  with > or = 2 CHD risk factors. Marland Kitchen LDL-C is now calculated using the Martin-Hopkins  calculation, which is a validated novel method providing  better accuracy than the Friedewald equation in the  estimation of LDL-C.  Cresenciano Genre et al. Annamaria Helling. 5329;924(26): 2061-2068  (http://education.[facility].com/faq/FAQ164)    HDL  Date Value Ref Range Status  11/21/2021 58 > OR = 40 mg/dL Final  11/09/2015 43 >39 mg/dL Final   Triglycerides  Date Value Ref Range Status  11/21/2021 55 <150 mg/dL Final         Passed - Patient is not pregnant      Passed - Valid encounter within last 12 months    Recent Outpatient Visits           12 months ago Hypothyroidism due to acquired atrophy of thyroid   [facility] Teodora Medici, DO   1 year ago Mixed hyperlipidemia   [facility] Delsa Grana, PA-C   3 years ago Hypothyroidism due to acquired atrophy of thyroid   [facility] Delsa Grana, PA-C   4 years ago Hypothyroidism due to acquired atrophy of thyroid   [place], Satira Anis, MD   5 years ago  Annual physical exam   [facility], Satira Anis, MD       Future Appointments             In 1 week Teodora Medici, [facility], [place]   In 1 week  [facility], [facility]             levothyroxine (SYNTHROID) 112 MCG tablet [Pharmacy Med Name: LEVOTHYROXINE 112 MCG TABLET] 90 tablet 0    Sig: TAKE ONE TABLET BY MOUTH EVERY MORNING BEFORE BREAKFAST     Endocrinology:  Hypothyroid Agents Passed - 11/15/2022 11:56 AM      Passed - TSH in normal range and within 360 days    TSH  Date Value Ref Range Status  09/23/2022 1.234 0.350 - 4.500 uIU/mL Final    Comment:    Performed by a 3rd Generation assay with a functional sensitivity of <=0.01 uIU/mL. Performed at [facility], [address] [place], [place] [address]   11/21/2021 0.72 0.40 - 4.50 mIU/L Final         Passed - Valid encounter within last 12 months    Recent Outpatient Visits           12 months ago Hypothyroidism due to acquired atrophy  of thyroid   [place], DO   1 year ago Mixed hyperlipidemia   [facility] Delsa Grana, PA-C   3 years ago Hypothyroidism due to acquired atrophy of thyroid   [facility] Delsa Grana, PA-C   4 years ago Hypothyroidism due to acquired atrophy of thyroid   [place], Satira Anis, MD   5 years ago Annual physical exam   [facility], Satira Anis, MD       Future Appointments             In 1 week Teodora Medici, [facility], [facility]   In 1 week  [facility], [facility]

## 2022-11-24 NOTE — Progress Notes (Unsigned)
Established Patient Office Visit  Subjective:  Patient ID: Marc Jackson, male    DOB: 1945/12/12  Age: 77 y.o. MRN: [id number]  CC:  No chief complaint on file.   HPI Marc Jackson presents for follow up on chronic medical conditions.   Hypothyroidism: -Medications: Levothyroxine 112 mcg -Patient is compliant with the above medication (s) at the above dose and reports no medication side effects.  -Denies weight changes, cold./heat intolerance, skin changes, anxiety/palpitations  -Last TSH: 12/23 1.234  HLD: -Medications: Lipitor 20 mg -Patient is compliant with above medications and reports no side effects.  -Last lipid panel: 2/23 Lipid Panel     Component Value Date/Time   CHOL 128 11/21/2021 0950   CHOL 217 (H) 11/09/2015 1148   TRIG 55 11/21/2021 0950   HDL 58 11/21/2021 0950   HDL 43 11/09/2015 1148   CHOLHDL 2.2 11/21/2021 0950   VLDL 34 (H) 11/11/2016 0915   LDLCALC 56 11/21/2021 0950   LABVLDL 38 11/09/2015 1148   The ASCVD Risk score (Arnett DK, et al., 2019) failed to calculate for the following reasons:   The valid total cholesterol range is 130 to 320 mg/dL  Pre-Diabetes: -Last A1c 5.5 2/23 -Not currently on any medications  Health Maintenance: -Blood work due -Colonoscopy 4/21, repeat in 10 years  Past Medical History:  Diagnosis Date   Alcohol use 11/23/2016   Allergic rhinitis with postnasal drip 11/09/2015   Allergy    Penicillin, Tetnus Toxoid   Annual physical exam 11/09/2015   Cancer [facility])    SKIN   Cataract    Hyperglycemia 11/10/2017   Hyperlipidemia    Hypothyroidism    IBS (irritable bowel syndrome)    Thyroid disease     Past Surgical History:  Procedure Laterality Date   BACK SURGERY     CATARACT EXTRACTION W/PHACO Right 11/24/2017   Procedure: CATARACT EXTRACTION PHACO AND INTRAOCULAR LENS PLACEMENT ([place]);  Surgeon: Birder Robson, MD;  Location: [facility] ORS;  Service: Ophthalmology;  Laterality: Right;  [place] 00:28.3 AP%  14.6 CDE 4.12 Fluid pack Lot # [id number]   CATARACT EXTRACTION W/PHACO Left 12/29/2017   Procedure: CATARACT EXTRACTION PHACO AND INTRAOCULAR LENS PLACEMENT ([facility]);  Surgeon: Birder Robson, MD;  Location: [facility] ORS;  Service: Ophthalmology;  Laterality: Left;  [place] 00:30.4 AP% 14.0 CDE 4.25 Fluid Pack Lot # [id number]   CO2 LASER OF [place]     COLONOSCOPY WITH PROPOFOL N/A 02/10/2020   Procedure: COLONOSCOPY WITH PROPOFOL;  Surgeon: [name], MD;  Location[facility] ENDOSCOPY;  Service: Gastroenterology;  Laterality: N/A;  COVID POSITIVE ON December 24, 2019   discetomy     EYE SURGERY     Cataracts; laser procedure for torn retina   HERNIA REPAIR     X 2   SKIN CANCER DESTRUCTION     SPINE SURGERY     Partial diskectomy, laminectomy L4-L5    Family History  Problem Relation Age of Onset   Cancer Father        prostate   Heart disease Father    Hypothyroidism Sister    Heart disease Mother        CHF, atrial fibrillation   Hyperlipidemia Mother        high TG   Diabetes Brother    Heart disease Brother    Obesity Brother    Diabetes Paternal Grandmother     Social History   Socioeconomic History   Marital status: Married    Spouse name: Not on  file   Number of children: 0   Years of education: Not on file   Highest education level: Master's degree (e.g., MA, MS, MEng, MEd, MSW, MBA)  Occupational History   Not on file  Tobacco Use   Smoking status: Former   Smokeless tobacco: Never   Tobacco comments:    Quit 1985  Vaping Use   Vaping Use: Never used  Substance and Sexual Activity   Alcohol use: Yes    Alcohol/week: 28.0 standard drinks of alcohol    Types: 28 Glasses of wine per week   Drug use: No   Sexual activity: Yes    Partners: Female    Birth control/protection: None  Other Topics Concern   Not on file  Social History Narrative   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, avid cyclist 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   Social Determinants of [place] Strain: [place]  (11/21/2021)   Overall Financial Resource Strain (CARDIA)    Difficulty of Paying Living Expenses: Not hard at all  Food Insecurity: No Food Insecurity (11/21/2021)   Hunger Vital Sign    Worried About Running Out of Food in the Last Year: Never true    [place] in the Last Year: Never true  Transportation Needs: No Transportation Needs (11/21/2021)   PRAPARE - Hydrologist (Medical): No    Lack of Transportation (Non-Medical): No  Physical Activity: Sufficiently Active (11/21/2021)   Exercise Vital Sign    Days of Exercise per Week: 4 days    Minutes of Exercise per Session: 50 min  Stress: No Stress Concern Present (11/21/2021)   [place]    Feeling of Stress : Not at all  Social Connections: [place] (11/21/2021)   Social Connection and Isolation Panel [NHANES]    Frequency of Communication with Friends and Family: More than three times a week    Frequency of Social Gatherings with Friends and Family: Once a week    Attends Religious Services: More than 4 times per year    Active Member of [employer] or Organizations: Yes    Attends Music therapist: More than 4 times per year    Marital Status: Married  Human resources officer Violence: Not At Risk (11/21/2021)   Humiliation, Afraid, Rape, and Kick questionnaire    Fear of Current or Ex-Partner: No    Emotionally Abused: No    Physically Abused: No    Sexually Abused: No    Outpatient Medications Prior to Visit  Medication Sig Dispense Refill   aspirin EC 81 MG tablet Take 81 mg by mouth 4 (four) times a week.     atorvastatin (LIPITOR) 20 MG tablet TAKE ONE TABLET BY MOUTH EVERY NIGHT AT BEDTIME 90 tablet 0   levothyroxine (SYNTHROID) 112 MCG tablet TAKE ONE TABLET BY MOUTH EVERY MORNING BEFORE BREAKFAST 90 tablet 0   No  facility-administered medications prior to visit.    Allergies  Allergen Reactions   Tetanus Toxoids Swelling    Fever    Penicillins Swelling and Rash    Has patient had a PCN reaction causing immediate rash, facial/tongue/throat swelling, SOB or lightheadedness with hypotension: yes Has patient had a PCN reaction causing severe rash involving mucus membranes or skin necrosis: no Has patient had a PCN reaction that required hospitalization: no Has patient had  a PCN reaction occurring within the last 10 years: no If all of the above answers are "NO", then may proceed with Cephalosporin use.     ROS Review of Systems  Constitutional:  Negative for chills and fever.  Eyes:  Negative for visual disturbance.  Respiratory:  Negative for cough and shortness of breath.   Cardiovascular:  Negative for chest pain.  Gastrointestinal:  Negative for abdominal pain.      Objective:    Physical Exam Constitutional:      Appearance: Normal appearance.  HENT:     Head: Normocephalic and atraumatic.  Eyes:     Conjunctiva/sclera: Conjunctivae normal.  Cardiovascular:     Rate and Rhythm: Normal rate and regular rhythm.  Pulmonary:     Effort: Pulmonary effort is normal.     Breath sounds: Normal breath sounds.  Musculoskeletal:     Right lower leg: No edema.     Left lower leg: No edema.  Skin:    General: Skin is warm and dry.  Neurological:     General: No focal deficit present.     Mental Status: He is alert. Mental status is at baseline.  Psychiatric:        Mood and Affect: Mood normal.        Behavior: Behavior normal.     There were no vitals taken for this visit. Wt Readings from Last 3 Encounters:  09/23/22 176 lb (79.8 kg)  11/21/21 176 lb 1.6 oz (79.9 kg)  11/21/21 176 lb 1.6 oz (79.9 kg)     Health Maintenance Due  Topic Date Due   DTaP/Tdap/Td (1 - Tdap) Never done   COVID-19 Vaccine (7 - 2023-24 season) 10/09/2022   Medicare Annual Wellness (AWV)   11/21/2022    There are no preventive care reminders to display for this patient.  Lab Results  Component Value Date   TSH 1.234 09/23/2022   Lab Results  Component Value Date   WBC 4.3 09/23/2022   HGB 14.1 09/23/2022   HCT 43.1 09/23/2022   MCV 100.5 (H) 09/23/2022   PLT 186 09/23/2022   Lab Results  Component Value Date   NA 141 09/23/2022   K 4.0 09/23/2022   CO2 28 09/23/2022   GLUCOSE 99 09/23/2022   BUN 17 09/23/2022   CREATININE 0.96 09/23/2022   BILITOT 1.2 09/23/2022   ALKPHOS 100 09/23/2022   AST 35 09/23/2022   ALT 35 09/23/2022   PROT 7.2 09/23/2022   ALBUMIN 4.3 09/23/2022   CALCIUM 8.9 09/23/2022   ANIONGAP 8 09/23/2022   EGFR 90 11/21/2021   Lab Results  Component Value Date   CHOL 128 11/21/2021   Lab Results  Component Value Date   HDL 58 11/21/2021   Lab Results  Component Value Date   LDLCALC 56 11/21/2021   Lab Results  Component Value Date   TRIG 55 11/21/2021   Lab Results  Component Value Date   CHOLHDL 2.2 11/21/2021   Lab Results  Component Value Date   HGBA1C 5.5 11/21/2021      Assessment & Plan:   Problem List Items Addressed This Visit   None  Follow-up: No follow-ups on file.    Teodora Medici, DO

## 2022-11-25 ENCOUNTER — Ambulatory Visit: Payer: Medicare Other

## 2022-11-25 ENCOUNTER — Ambulatory Visit (INDEPENDENT_AMBULATORY_CARE_PROVIDER_SITE_OTHER): Payer: Medicare Other | Admitting: Internal Medicine

## 2022-11-25 ENCOUNTER — Encounter: Payer: Self-pay | Admitting: Internal Medicine

## 2022-11-25 VITALS — BP 116/68 | HR 61 | Temp 97.7°F | Resp 16 | Ht 73.0 in | Wt 172.7 lb

## 2022-11-25 DIAGNOSIS — Z8042 Family history of malignant neoplasm of prostate: Secondary | ICD-10-CM

## 2022-11-25 DIAGNOSIS — R351 Nocturia: Secondary | ICD-10-CM | POA: Diagnosis not present

## 2022-11-25 DIAGNOSIS — E034 Atrophy of thyroid (acquired): Secondary | ICD-10-CM | POA: Diagnosis not present

## 2022-11-25 DIAGNOSIS — Z125 Encounter for screening for malignant neoplasm of prostate: Secondary | ICD-10-CM

## 2022-11-25 DIAGNOSIS — E782 Mixed hyperlipidemia: Secondary | ICD-10-CM

## 2022-11-25 DIAGNOSIS — R7303 Prediabetes: Secondary | ICD-10-CM | POA: Diagnosis not present

## 2022-11-25 MED ORDER — ATORVASTATIN CALCIUM 20 MG PO TABS: 20.0000 mg | ORAL_TABLET | Freq: Every day | ORAL | 3 refills | Status: DC

## 2022-11-25 MED ORDER — LEVOTHYROXINE SODIUM 112 MCG PO TABS: 112.0000 ug | ORAL_TABLET | Freq: Every day | ORAL | 3 refills | Status: DC

## 2022-11-25 NOTE — Patient Instructions (Addendum)
It was great seeing you today!  Plan discussed at today's visit: -Blood work ordered today, results will be uploaded to [place].  -Medications refilled   Follow up in: 1 year or sooner as needed   Take care and let us know if you have any questions or concerns prior to your next visit.  Dr. Rosana Berger

## 2022-11-26 DIAGNOSIS — G4733 Obstructive sleep apnea (adult) (pediatric): Secondary | ICD-10-CM | POA: Diagnosis not present

## 2022-11-26 LAB — COMPLETE METABOLIC PANEL WITH GFR
AG Ratio: 1.8 (calc) (ref 1.0–2.5)
ALT: 25 U/L (ref 9–46)
AST: 24 U/L (ref 10–35)
Albumin: 4.4 g/dL (ref 3.6–5.1)
Alkaline phosphatase (APISO): 85 U/L (ref 35–144)
BUN: 14 mg/dL (ref 7–25)
CO2: 29 mmol/L (ref 20–32)
Calcium: 9.3 mg/dL (ref 8.6–10.3)
Chloride: 105 mmol/L (ref 98–110)
Creat: 0.8 mg/dL (ref 0.70–1.28)
Globulin: 2.5 g/dL (calc) (ref 1.9–3.7)
Glucose, Bld: 91 mg/dL (ref 65–99)
Potassium: 4.2 mmol/L (ref 3.5–5.3)
Sodium: 142 mmol/L (ref 135–146)
Total Bilirubin: 0.9 mg/dL (ref 0.2–1.2)
Total Protein: 6.9 g/dL (ref 6.1–8.1)
eGFR: 92 mL/min/{1.73_m2} (ref >=60)

## 2022-11-26 LAB — HEMOGLOBIN A1C
Hgb A1c MFr Bld: 5.8 % of total Hgb — ABNORMAL HIGH (ref <5.7)
Mean Plasma Glucose: 120 mg/dL
eAG (mmol/L): 6.6 mmol/L

## 2022-11-26 LAB — LIPID PANEL
Cholesterol: 128 mg/dL (ref <200)
HDL: 70 mg/dL (ref >=40)
LDL Cholesterol (Calc): 45 mg/dL (calc)
Non-HDL Cholesterol (Calc): 58 mg/dL (calc) (ref <130)
Total CHOL/HDL Ratio: 1.8 (calc) (ref <5.0)
Triglycerides: 53 mg/dL (ref <150)

## 2022-11-26 LAB — CBC WITH DIFFERENTIAL/PLATELET
Absolute Monocytes: 366 cells/uL (ref 200–950)
Basophils Absolute: 41 cells/uL (ref 0–200)
Basophils Relative: 1.1 %
Eosinophils Absolute: 218 cells/uL (ref 15–500)
Eosinophils Relative: 5.9 %
HCT: 42.8 % (ref 38.5–50.0)
Hemoglobin: 14.6 g/dL (ref 13.2–17.1)
Lymphs Abs: 1243 cells/uL (ref 850–3900)
MCH: 33.3 pg — ABNORMAL HIGH (ref 27.0–33.0)
MCHC: 34.1 g/dL (ref 32.0–36.0)
MCV: 97.5 fL (ref 80.0–100.0)
MPV: 10.1 fL (ref 7.5–12.5)
Monocytes Relative: 9.9 %
Neutro Abs: 1832 cells/uL (ref 1500–7800)
Neutrophils Relative %: 49.5 %
Platelets: 182 10*3/uL (ref 140–400)
RBC: 4.39 10*6/uL (ref 4.20–5.80)
RDW: 12.8 % (ref 11.0–15.0)
Total Lymphocyte: 33.6 %
WBC: 3.7 10*3/uL — ABNORMAL LOW (ref 3.8–10.8)

## 2022-11-26 LAB — PSA: PSA: 0.66 ng/mL (ref <=4.00)

## 2022-11-28 ENCOUNTER — Ambulatory Visit (INDEPENDENT_AMBULATORY_CARE_PROVIDER_SITE_OTHER): Payer: PRIVATE HEALTH INSURANCE

## 2022-11-28 VITALS — Ht 73.0 in | Wt 172.0 lb

## 2022-11-28 DIAGNOSIS — Z0000 Encounter for general adult medical examination without abnormal findings: Secondary | ICD-10-CM | POA: Diagnosis not present

## 2022-11-28 NOTE — Patient Instructions (Addendum)
Marc Jackson , Thank you for taking time to come for your Medicare Wellness Visit. I appreciate your ongoing commitment to your health goals. Please review the following plan we discussed and let me know if I can assist you in the future.   These are the goals we discussed:  Goals      Increase physical activity     Recommend exercising 150 minutes per week     Weight (lb) < 189 lb 6.4 oz (85.9 kg)        This is a list of the screening recommended for you and due dates:  Health Maintenance  Topic Date Due   COVID-19 Vaccine (7 - 2023-24 season) 12/11/2022*   Medicare Annual Wellness Visit  11/29/2023   Colon Cancer Screening  02/09/2030   Pneumonia Vaccine  Completed   Flu Shot  Completed   Hepatitis C Screening: USPSTF Recommendation to screen - Ages 18-79 yo.  Completed   Zoster (Shingles) Vaccine  Completed   HPV Vaccine  Aged Out   DTaP/Tdap/Td vaccine  Discontinued  *Topic was postponed. The date shown is not the original due date.    Advanced directives: yes  Conditions/risks identified: none  Next appointment: Follow up in one year for your annual wellness visit. 12/03/2023@0915$   Preventive Care 65 Years and Older, Male  Preventive care refers to lifestyle choices and visits with your health care provider that can promote health and wellness. What does preventive care include? A yearly physical exam. This is also called an annual well check. Dental exams once or twice a year. Routine eye exams. Ask your health care provider how often you should have your eyes checked. Personal lifestyle choices, including: Daily care of your teeth and gums. Regular physical activity. Eating a healthy diet. Avoiding tobacco and drug use. Limiting alcohol use. Practicing safe sex. Taking low doses of aspirin every day. Taking vitamin and mineral supplements as recommended by your health care provider. What happens during an annual well check? The services and screenings done by  your health care provider during your annual well check will depend on your age, overall health, lifestyle risk factors, and family history of disease. Counseling  Your health care provider may ask you questions about your: Alcohol use. Tobacco use. Drug use. Emotional well-being. Home and relationship well-being. Sexual activity. Eating habits. History of falls. Memory and ability to understand (cognition). Work and work Statistician. Screening  You may have the following tests or measurements: Height, weight, and BMI. Blood pressure. Lipid and cholesterol levels. These may be checked every 5 years, or more frequently if you are over 29 years old. Skin check. Lung cancer screening. You may have this screening every year starting at age 41 if you have a 30-pack-year history of smoking and currently smoke or have quit within the past 15 years. Fecal occult blood test (FOBT) of the stool. You may have this test every year starting at age 30. Flexible sigmoidoscopy or colonoscopy. You may have a sigmoidoscopy every 5 years or a colonoscopy every 10 years starting at age 3. Prostate cancer screening. Recommendations will vary depending on your family history and other risks. Hepatitis C blood test. Hepatitis B blood test. Sexually transmitted disease (STD) testing. Diabetes screening. This is done by checking your blood sugar (glucose) after you have not eaten for a while (fasting). You may have this done every 1-3 years. Abdominal aortic aneurysm (AAA) screening. You may need this if you are a current or former smoker. Osteoporosis.  You may be screened starting at age 47 if you are at high risk. Talk with your health care provider about your test results, treatment options, and if necessary, the need for more tests. Vaccines  Your health care provider may recommend certain vaccines, such as: Influenza vaccine. This is recommended every year. Tetanus, diphtheria, and acellular pertussis  (Tdap, Td) vaccine. You may need a Td booster every 10 years. Zoster vaccine. You may need this after age 25. Pneumococcal 13-valent conjugate (PCV13) vaccine. One dose is recommended after age 41. Pneumococcal polysaccharide (PPSV23) vaccine. One dose is recommended after age 28. Talk to your health care provider about which screenings and vaccines you need and how often you need them. This information is not intended to replace advice given to you by your health care provider. Make sure you discuss any questions you have with your health care provider. Document Released: 10/26/2015 Document Revised: 06/18/2016 Document Reviewed: 07/31/2015 Elsevier Interactive Patient Education  2017 [employer] Prevention in the Home Falls can cause injuries. They can happen to people of all ages. There are many things you can do to make your home safe and to help prevent falls. What can I do on the outside of my home? Regularly fix the edges of walkways and driveways and fix any cracks. Remove anything that might make you trip as you walk through a door, such as a raised step or threshold. Trim any bushes or trees on the path to your home. Use bright outdoor lighting. Clear any walking paths of anything that might make someone trip, such as rocks or tools. Regularly check to see if handrails are loose or broken. Make sure that both sides of any steps have handrails. Any raised decks and porches should have guardrails on the edges. Have any leaves, snow, or ice cleared regularly. Use sand or salt on walking paths during winter. Clean up any spills in your garage right away. This includes oil or grease spills. What can I do in the bathroom? Use night lights. Install grab bars by the toilet and in the tub and shower. Do not use towel bars as grab bars. Use non-skid mats or decals in the tub or shower. If you need to sit down in the shower, use a plastic, non-slip stool. Keep the floor dry. Clean  up any water that spills on the floor as soon as it happens. Remove soap buildup in the tub or shower regularly. Attach bath mats securely with double-sided non-slip rug tape. Do not have throw rugs and other things on the floor that can make you trip. What can I do in the bedroom? Use night lights. Make sure that you have a light by your bed that is easy to reach. Do not use any sheets or blankets that are too big for your bed. They should not hang down onto the floor. Have a firm chair that has side arms. You can use this for support while you get dressed. Do not have throw rugs and other things on the floor that can make you trip. What can I do in the kitchen? Clean up any spills right away. Avoid walking on wet floors. Keep items that you use a lot in easy-to-reach places. If you need to reach something above you, use a strong step stool that has a grab bar. Keep electrical cords out of the way. Do not use floor polish or wax that makes floors slippery. If you must use wax, use non-skid floor wax.  Do not have throw rugs and other things on the floor that can make you trip. What can I do with my stairs? Do not leave any items on the stairs. Make sure that there are handrails on both sides of the stairs and use them. Fix handrails that are broken or loose. Make sure that handrails are as long as the stairways. Check any carpeting to make sure that it is firmly attached to the stairs. Fix any carpet that is loose or worn. Avoid having throw rugs at the top or bottom of the stairs. If you do have throw rugs, attach them to the floor with carpet tape. Make sure that you have a light switch at the top of the stairs and the bottom of the stairs. If you do not have them, ask someone to add them for you. What else can I do to help prevent falls? Wear shoes that: Do not have high heels. Have rubber bottoms. Are comfortable and fit you well. Are closed at the toe. Do not wear sandals. If you  use a stepladder: Make sure that it is fully opened. Do not climb a closed stepladder. Make sure that both sides of the stepladder are locked into place. Ask someone to hold it for you, if possible. Clearly mark and make sure that you can see: Any grab bars or handrails. First and last steps. Where the edge of each step is. Use tools that help you move around (mobility aids) if they are needed. These include: Canes. Walkers. Scooters. Crutches. Turn on the lights when you go into a dark area. Replace any light bulbs as soon as they burn out. Set up your furniture so you have a clear path. Avoid moving your furniture around. If any of your floors are uneven, fix them. If there are any pets around you, be aware of where they are. Review your medicines with your doctor. Some medicines can make you feel dizzy. This can increase your chance of falling. Ask your doctor what other things that you can do to help prevent falls. This information is not intended to replace advice given to you by your health care provider. Make sure you discuss any questions you have with your health care provider. Document Released: 07/26/2009 Document Revised: 03/06/2016 Document Reviewed: 11/03/2014 Elsevier Interactive Patient Education  2017 [employer].

## 2022-11-28 NOTE — Progress Notes (Signed)
I connected with  Marc Jackson on 11/28/22 by a audio enabled telemedicine application and verified that I am speaking with the correct person using two identifiers.  Patient Location: Home  Provider Location: Office/Clinic  I discussed the limitations of evaluation and management by telemedicine. The patient expressed understanding and agreed to proceed.  Subjective:   Marc Jackson is a 77 y.o. male who presents for Medicare Annual/Subsequent preventive examination.  Review of Systems    Cardiac Risk Factors include: advanced age (>52mn, >>39women);dyslipidemia;male gender    Objective:    Today's Vitals   11/28/22 0936  Weight: 172 lb (78 kg)  Height: 6' 1"$  (1.854 m)   Body mass index is 22.69 kg/m.     09/23/2022    4:36 PM 11/21/2021    9:01 AM 11/20/2020    8:48 AM 02/10/2020    8:33 AM 11/17/2019    9:07 AM 11/12/2018    9:43 AM 10/06/2018    6:01 PM  Advanced Directives  Does Patient Have a Medical Advance Directive? No Yes Yes Yes Yes Yes No  Type of ACorporate treasurerof AIrontonLiving will HTowerLiving will Living will HTerrace ParkLiving will HAlexandriaLiving will   Copy of HWilsonin Chart?  Yes - validated most recent copy scanned in chart (See row information) No - copy requested  No - copy requested No - copy requested   Would patient like information on creating a medical advance directive? No - Patient declined          Current Medications (verified) Outpatient Encounter Medications as of 11/28/2022  Medication Sig   atorvastatin (LIPITOR) 20 MG tablet Take 1 tablet (20 mg total) by mouth at bedtime.   levothyroxine (SYNTHROID) 112 MCG tablet Take 1 tablet (112 mcg total) by mouth daily with breakfast.   No facility-administered encounter medications on file as of 11/28/2022.    Allergies (verified) Tetanus toxoids and Penicillins   History: Past Medical  History:  Diagnosis Date   Alcohol use 11/23/2016   Allergic rhinitis with postnasal drip 11/09/2015   Allergy    Penicillin, Tetnus Toxoid   Annual physical exam 11/09/2015   Cancer ([facility]    SKIN   Cataract    Hyperglycemia 11/10/2017   Hyperlipidemia    Hypothyroidism    IBS (irritable bowel syndrome)    Thyroid disease    Past Surgical History:  Procedure Laterality Date   BACK SURGERY     CATARACT EXTRACTION W/PHACO Right 11/24/2017   Procedure: CATARACT EXTRACTION PHACO AND INTRAOCULAR LENS PLACEMENT ([place];  Surgeon: PBirder Robson MD;  Location: [facility] ORS;  Service: Ophthalmology;  Laterality: Right;  [place]00:28.3 AP% 14.6 CDE 4.12 Fluid pack Lot # [id number]   CATARACT EXTRACTION W/PHACO Left 12/29/2017   Procedure: CATARACT EXTRACTION PHACO AND INTRAOCULAR LENS PLACEMENT (IOC);  Surgeon: PBirder Robson MD;  Location: [facility] ORS;  Service: Ophthalmology;  Laterality: Left;  [place]00:30.4 AP% 14.0 CDE 4.25 Fluid Pack Lot # [id number]   CO2 LASER OF LEUKOPLAKIA     COLONOSCOPY WITH PROPOFOL N/A 02/10/2020   Procedure: COLONOSCOPY WITH PROPOFOL;  Surgeon: AJonathon Bellows MD;  [facility]  Service: Gastroenterology;  Laterality: N/A;  COVID POSITIVE ON December 24, 2019   discetomy     EYE SURGERY     Cataracts; laser procedure for torn retina   HERNIA REPAIR     X 2   SKIN CANCER DESTRUCTION  SPINE SURGERY     Partial diskectomy, laminectomy L4-L5   Family History  Problem Relation Age of Onset   Cancer Father        prostate   Heart disease Father    Hypothyroidism Sister    Heart disease Mother        CHF, atrial fibrillation   Hyperlipidemia Mother        high TG   Diabetes Brother    Heart disease Brother    Obesity Brother    Diabetes Paternal Grandmother    Social History   Socioeconomic History   Marital status: Married    Spouse name: Not on file   Number of children: 0   Years of education: Not on file   Highest education level: Master's  degree (e.g., MA, MS, MEng, MEd, MSW, MBA)  Occupational History   Not on file  Tobacco Use   Smoking status: Former   Smokeless tobacco: Never   Tobacco comments:    Quit 1985  Vaping Use   Vaping Use: Never used  Substance and Sexual Activity   Alcohol use: Yes    Alcohol/week: 28.0 standard drinks of alcohol    Types: 28 Glasses of wine per week   Drug use: No   Sexual activity: Yes    Partners: Female    Birth control/protection: None  Other Topics Concern   Not on file  Social History Narrative   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, 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   Social Determinants of Health   Financial Resource Strain: [place]  (11/21/2021)   Overall Financial Resource Strain (CARDIA)    Difficulty of Paying Living Expenses: Not hard at all  Food Insecurity: No Food Insecurity (11/21/2021)   Hunger Vital Sign    Worried About Running Out of Food in the Last Year: Never true    [place] in the Last Year: Never true  Transportation Needs: No Transportation Needs (11/28/2022)   PRAPARE - Hydrologist (Medical): No    Lack of Transportation (Non-Medical): No  Physical Activity: Sufficiently Active (11/28/2022)   Exercise Vital Sign    Days of Exercise per Week: 4 days    Minutes of Exercise per Session: 60 min  Stress: No Stress Concern Present (11/28/2022)   [place]    Feeling of Stress : Not at all  Social Connections: [place] (11/28/2022)   Social Connection and Isolation Panel [NHANES]    Frequency of Communication with Friends and Family: More than three times a week    Frequency of Social Gatherings with Friends and Family: More than three times a week    Attends Religious Services: More than 4 times per year    Active Member of [employer] or Organizations: Yes    Attends Arts development officer: More than 4 times per year    Marital Status: Married    Tobacco Counseling Counseling given: Not Answered Tobacco comments: Quit 1985   Clinical Intake:  Pre-visit preparation completed: Yes  Pain : No/denies pain     BMI - recorded: 22.69 Nutritional Status: BMI of 19-24  Normal Nutritional Risks: None Diabetes: No  How often do you need to have someone help you when you read instructions, pamphlets, or other written materials from your doctor or pharmacy?: 1 - Never  Diabetic?no  Interpreter Needed?: No  Information entered by :: B.Erin Uecker,LPN   Activities of Daily Living    11/28/2022    9:45 AM 11/25/2022    9:44 AM  In your present state of health, do you have any difficulty performing the following activities:  Hearing? 0 0  Vision? 0 0  Difficulty concentrating or making decisions? 0 0  Walking or climbing stairs? 0 0  Dressing or bathing? 0 0  Doing errands, shopping? 0 0  Preparing Food and eating ? N   Using the Toilet? N   In the past six months, have you accidently leaked urine? N   Do you have problems with loss of bowel control? Y   Comment IBS   Managing your Medications? N   Managing your Finances? N   Housekeeping or managing your Housekeeping? N     Patient Care Team: Teodora Medici, DO as PCP - General (Internal Medicine) Dasher, Rayvon Char, MD (Dermatology)  Indicate any recent Medical Services you may have received from other than [facility] providers in the past year (date may be approximate).     Assessment:   This is a routine wellness examination for Sire.  Hearing/Vision screen Hearing Screening - Comments:: Adequate hearing Vision Screening - Comments:: Adequate vision:cataract surgery corrected.Dr Ellen Henri [facility]  Dietary issues and exercise activities discussed: Current Exercise Habits: Home exercise routine, Type of exercise: calisthenics, Time (Minutes): 60, Frequency (Times/Week): 5,  Weekly Exercise (Minutes/Week): 300, Intensity: Mild   Goals Addressed   None    Depression Screen    11/25/2022    9:43 AM 11/21/2021    8:59 AM 11/20/2020   10:15 AM 11/20/2020    8:47 AM 11/17/2019    9:05 AM 11/17/2019    8:17 AM 11/12/2018    9:45 AM  PHQ 2/9 Scores  PHQ - 2 Score 0 0 0 0 0 0 0  PHQ- 9 Score 0  0   0     Fall Risk    11/28/2022    9:40 AM 11/25/2022    9:43 AM 11/21/2021    9:01 AM 11/20/2020   10:14 AM 11/20/2020    8:49 AM  Fall Risk   Falls in the past year? 0 0 1 0 0  Number falls in past yr: 0 0 0 0 0  Injury with Fall? 0 0 0 0 0  Risk for fall due to : No Fall Risks  No Fall Risks  No Fall Risks  Follow up Education provided;Falls prevention discussed  Falls prevention discussed      FALL RISK PREVENTION PERTAINING TO THE HOME:  Any stairs in or around the home? Yes  If so, are there any without handrails? Yes  Home free of loose throw rugs in walkways, pet beds, electrical cords, etc? Yes  Adequate lighting in your home to reduce risk of falls? Yes   ASSISTIVE DEVICES UTILIZED TO PREVENT FALLS:  Life alert? No  Use of a cane, walker or w/c? No  Grab bars in the bathroom? No  Shower chair or bench in shower? No  Elevated toilet seat or a handicapped toilet? Yes      Cognitive Function:        11/28/2022    9:47 AM 11/12/2018    9:48 AM 11/10/2017    9:39 AM  6CIT Screen  What Year? 0 points 0 points 0 points  What month? 0 points 0 points 0 points  What time? 0 points 0 points 0 points  Count back from 20 0 points 0 points 0 points  Months in reverse 0 points 0 points 0 points  Repeat phrase 0 points 0 points 0 points  Total Score 0 points 0 points 0 points    Immunizations Immunization History  Administered Date(s) Administered   Fluad Quad(high Dose 65+) 07/19/2019, 08/16/2020   Influenza, High Dose Seasonal PF 08/24/2018, 08/02/2021   Influenza-Unspecified 07/30/2016, 07/30/2017, 08/14/2022   PFIZER(Purple Top)SARS-COV-2  Vaccination 12/03/2019, 01/03/2020, 07/10/2020, 01/15/2021   PNEUMOCOCCAL CONJUGATE-20 11/21/2021   Pfizer Covid-19 Vaccine Bivalent Booster 74yr & up 08/02/2021, 08/14/2022   Pneumococcal Conjugate-13 09/14/2014   Pneumococcal Polysaccharide-23 08/08/2013   Rsv, Bivalent, Protein Subunit Rsvpref,pf (Evans Lance 09/10/2022   Zoster Recombinat (Shingrix) 05/16/2018, 09/04/2018   Zoster, Live 07/13/2010    Tdap-ALLERIC-contraindicated  Flu Vaccine status: Up to date  Pneumococcal vaccine status: Up to date  Covid-19 vaccine status: Completed vaccines  Qualifies for Shingles Vaccine? Yes   Zostavax completed Yes   Shingrix Completed?: Yes  Screening Tests Health Maintenance  Topic Date Due   COVID-19 Vaccine (7 - 2023-24 season) 12/11/2022 (Originally 10/09/2022)   Medic[place] Annual Wellness ([place]  11/29/2023   COLONOSCOPY (Pts 45-429yrInsurance coverage will need to be confirmed)  02/09/2030   Pneumonia Vaccine 6565Years old  Completed   INFLUENZA VACCINE  Completed   Hepatitis C Screening  Completed   Zoster Vaccines- Shingrix  Completed   HPV VACCINES  Aged Out   DTaP/Tdap/Td  Discontinued    Health Maintenance  There are no preventive care reminders to display for this patient.   Colorectal cancer screening: No longer required.   Lung Cancer Screening: (Low Dose CT Chest recommended if Age 76-80ears, 30 pack-year currently smoking OR have quit w/in 15years.) does not qualify.   Lung Cancer Screening Referral: no  Additional Screening:  Hepatitis C Screening: does not qualify; Completed yes in past  Vision Screening: Recommended annual ophthalmology exams for early detection of glaucoma and other disorders of the eye. Is the patient up to date with their annual eye exam?  Yes  Who is the provider or what is the name of the office in which the patient attends annual eye exams? [place] pt is not established with a provider, would they like to be referred to  a provider to establish care? No .   Dental Screening: Recommended annual dental exams for proper oral hygiene  Community Resource Referral / Chronic Care Management: CRR required this visit?  No   CCM required this visit?  No      Plan:     I have personally reviewed and noted the following in the patient's chart:   Medical and social history Use of alcohol, tobacco or illicit drugs  Current medications and supplements including opioid prescriptions. Patient is not currently taking opioid prescriptions. Functional ability and status Nutritional status Physical activity Advanced directives List of other physicians Hospitalizations, surgeries, and ER visits in previous 12 months Vitals Screenings to include cognitive, depression, and falls Referrals and appointments  In addition, I have reviewed and discussed with patient certain preventive protocols, quality metrics, and best practice recommendations. A written personalized care plan for preventive services as well as general preventive health recommendations were provided to patient.     BrRoger ShelterLPN   [id number] Nurse Notes: pt is doing well, staying very active and busy with his spouse. He has no concerns or questions at this time.

## 2022-11-30 DIAGNOSIS — G4733 Obstructive sleep apnea (adult) (pediatric): Secondary | ICD-10-CM | POA: Diagnosis not present

## 2022-12-10 DIAGNOSIS — G4733 Obstructive sleep apnea (adult) (pediatric): Secondary | ICD-10-CM | POA: Diagnosis not present

## 2023-02-13 DIAGNOSIS — H57813 Brow ptosis, bilateral: Secondary | ICD-10-CM | POA: Diagnosis not present

## 2023-02-13 DIAGNOSIS — H02831 Dermatochalasis of right upper eyelid: Secondary | ICD-10-CM | POA: Diagnosis not present

## 2023-02-13 DIAGNOSIS — E785 Hyperlipidemia, unspecified: Secondary | ICD-10-CM | POA: Diagnosis not present

## 2023-02-13 DIAGNOSIS — H02834 Dermatochalasis of left upper eyelid: Secondary | ICD-10-CM | POA: Diagnosis not present

## 2023-02-13 DIAGNOSIS — E039 Hypothyroidism, unspecified: Secondary | ICD-10-CM | POA: Diagnosis not present

## 2023-06-19 DIAGNOSIS — Z23 Encounter for immunization: Secondary | ICD-10-CM | POA: Diagnosis not present

## 2023-09-02 DIAGNOSIS — Z961 Presence of intraocular lens: Secondary | ICD-10-CM | POA: Diagnosis not present

## 2023-09-02 DIAGNOSIS — H26492 Other secondary cataract, left eye: Secondary | ICD-10-CM | POA: Diagnosis not present

## 2023-09-02 DIAGNOSIS — H35372 Puckering of macula, left eye: Secondary | ICD-10-CM | POA: Diagnosis not present

## 2023-09-02 DIAGNOSIS — H57813 Brow ptosis, bilateral: Secondary | ICD-10-CM | POA: Diagnosis not present

## 2023-09-14 DIAGNOSIS — D2262 Melanocytic nevi of left upper limb, including shoulder: Secondary | ICD-10-CM | POA: Diagnosis not present

## 2023-09-14 DIAGNOSIS — D2271 Melanocytic nevi of right lower limb, including hip: Secondary | ICD-10-CM | POA: Diagnosis not present

## 2023-09-14 DIAGNOSIS — D2272 Melanocytic nevi of left lower limb, including hip: Secondary | ICD-10-CM | POA: Diagnosis not present

## 2023-09-14 DIAGNOSIS — L821 Other seborrheic keratosis: Secondary | ICD-10-CM | POA: Diagnosis not present

## 2023-09-14 DIAGNOSIS — D2261 Melanocytic nevi of right upper limb, including shoulder: Secondary | ICD-10-CM | POA: Diagnosis not present

## 2023-09-14 DIAGNOSIS — D225 Melanocytic nevi of trunk: Secondary | ICD-10-CM | POA: Diagnosis not present

## 2023-09-14 DIAGNOSIS — Z08 Encounter for follow-up examination after completed treatment for malignant neoplasm: Secondary | ICD-10-CM | POA: Diagnosis not present

## 2023-09-14 DIAGNOSIS — Z85828 Personal history of other malignant neoplasm of skin: Secondary | ICD-10-CM | POA: Diagnosis not present

## 2023-11-27 ENCOUNTER — Ambulatory Visit (INDEPENDENT_AMBULATORY_CARE_PROVIDER_SITE_OTHER): Payer: Medicare Other | Admitting: Internal Medicine

## 2023-11-27 ENCOUNTER — Other Ambulatory Visit: Payer: Self-pay

## 2023-11-27 ENCOUNTER — Encounter: Payer: Self-pay | Admitting: Internal Medicine

## 2023-11-27 VITALS — BP 120/72 | HR 68 | Temp 97.9°F | Resp 16 | Ht 73.0 in | Wt 176.4 lb

## 2023-11-27 DIAGNOSIS — R351 Nocturia: Secondary | ICD-10-CM | POA: Diagnosis not present

## 2023-11-27 DIAGNOSIS — R7303 Prediabetes: Secondary | ICD-10-CM | POA: Diagnosis not present

## 2023-11-27 DIAGNOSIS — Z0000 Encounter for general adult medical examination without abnormal findings: Secondary | ICD-10-CM

## 2023-11-27 DIAGNOSIS — Z8042 Family history of malignant neoplasm of prostate: Secondary | ICD-10-CM | POA: Diagnosis not present

## 2023-11-27 DIAGNOSIS — E782 Mixed hyperlipidemia: Secondary | ICD-10-CM

## 2023-11-27 DIAGNOSIS — Z125 Encounter for screening for malignant neoplasm of prostate: Secondary | ICD-10-CM | POA: Diagnosis not present

## 2023-11-27 DIAGNOSIS — E034 Atrophy of thyroid (acquired): Secondary | ICD-10-CM

## 2023-11-27 MED ORDER — LEVOTHYROXINE SODIUM 112 MCG PO TABS: 112.0000 ug | ORAL_TABLET | Freq: Every day | ORAL | 3 refills | Status: AC

## 2023-11-27 MED ORDER — ATORVASTATIN CALCIUM 20 MG PO TABS: 20.0000 mg | ORAL_TABLET | Freq: Every day | ORAL | 3 refills | Status: AC

## 2023-11-27 NOTE — Progress Notes (Signed)
Name: Marc Jackson   MRN: [id number]    DOB: 18-Jan-1946   Date:11/27/2023       Progress Note  Subjective  Chief Complaint  Chief Complaint  Patient presents with   Annual Exam    HPI  Patient presents for annual CPE .   Diet: Regular Exercise: 3 days week 45 minutes Last Dental Exam: completed Last Eye Exam: completed  Depression: phq 9 is negative    11/27/2023    9:03 AM 11/25/2022    9:43 AM 11/21/2021    8:59 AM 11/20/2020   10:15 AM 11/20/2020    8:47 AM  Depression screen PHQ 2/9  Decreased Interest 0 0 0 0 0  Down, Depressed, Hopeless 0 0 0 0 0  PHQ - 2 Score 0 0 0 0 0  Altered sleeping  0  0   Tired, decreased energy  0  0   Change in appetite  0  0   Feeling bad or failure about yourself   0  0   Trouble concentrating  0  0   Moving slowly or fidgety/restless  0  0   Suicidal thoughts  0  0   PHQ-9 Score  0  0   Difficult doing work/chores  Not difficult at all       Hypertension:  BP Readings from Last 3 Encounters:  11/27/23 120/72  11/25/22 116/68  09/23/22 120/70    Obesity: Wt Readings from Last 3 Encounters:  11/27/23 176 lb 6.4 oz (80 kg)  11/28/22 172 lb (78 kg)  11/25/22 172 lb 11.2 oz (78.3 kg)   BMI Readings from Last 3 Encounters:  11/27/23 23.27 kg/m  11/28/22 22.69 kg/m  11/25/22 22.79 kg/m     Flowsheet Row Office Visit from 11/27/2023 in [facility]  AUDIT-C Score 6        Married STD testing and prevention (HIV/chl/gon/syphilis):  no concerns Hep C Screening: completed Skin cancer: Discussed monitoring for atypical lesions Colorectal cancer: 02/10/2020 colonoscopy, repeat in 10 years  Prostate cancer:  yes Lab Results  Component Value Date   PSA 0.66 11/25/2022   PSA 0.59 11/21/2021   PSA 0.69 11/20/2020    Lung cancer:  Low Dose CT Chest recommended if Age 77-80 years, 30 pack-year currently smoking OR have quit w/in 15years. Patient  is not a candidate for screening   AAA: The USPSTF  recommends one-time screening with ultrasonography in men ages 77 to 75 years who have ever smoked. Patient   is not a candidate for screening  ECG:  09/24/22  Vaccines: reviewed with the patient. UTD  Advanced Care Planning: A voluntary discussion about advance care planning including the explanation and discussion of advance directives.  Discussed health care proxy and Living will, and the patient was able to identify a health care proxy as Ngoc Daughtridge (wife).  Patient does have a living will and power of attorney of health care   Patient Active Problem List   Diagnosis Date Noted   Prediabetes 11/17/2019   Hyperlipidemia 11/11/2016   Family history of prostate cancer in father 11/11/2016   Hypothyroidism due to acquired atrophy of thyroid 11/09/2015   History of skin cancer 11/09/2015    Past Surgical History:  Procedure Laterality Date   BACK SURGERY     CATARACT EXTRACTION W/PHACO Right 11/24/2017   Procedure: CATARACT EXTRACTION PHACO AND INTRAOCULAR LENS PLACEMENT (IOC);  Surgeon: Galen Manila, MD;  Location: [facility] ORS;  Service: Ophthalmology;  Laterality: Right;  [place] 00:28.3 AP% 14.6 CDE 4.12 Fluid pack Lot # [id number]   CATARACT EXTRACTION W/PHACO Left 12/29/2017   Procedure: CATARACT EXTRACTION PHACO AND INTRAOCULAR LENS PLACEMENT (IOC);  Surgeon: Galen Manila, MD;  Location: [facility] ORS;  Service: Ophthalmology;  Laterality: Left;  [place] 00:30.4 AP% 14.0 CDE 4.25 Fluid Pack Lot # [id number]   CO2 LASER OF [place]KIA     COLONOSCOPY WITH PROPOFOL N/A 02/10/2020   Procedure: COLONOSCOPY WITH PROPOFOL;  Surgeon: Wyline Mood, MD;  Location: [facility];  Service: Gastroenterology;  Laterality: N/A;  COVID POSITIVE ON December 24, 2019   discetomy     EYE SURGERY     Cataracts; laser procedure for torn retina   HERNIA REPAIR     X 2   SKIN CANCER DESTRUCTION     SPINE SURGERY     Partial diskectomy, laminectomy L4-L5    Family History  Problem Relation Age of Onset    Cancer Father        prostate   Heart disease Father    Hypothyroidism Sister    Heart disease Mother        CHF, atrial fibrillation   Hyperlipidemia Mother        high TG   Diabetes Brother    Heart disease Brother    Obesity Brother    Diabetes Paternal Grandmother     Social History   Socioeconomic History   Marital status: Married    Spouse name: Not on file   Number of children: 0   Years of education: Not on file   Highest education level: Master's degree (e.g., MA, MS, MEng, MEd, MSW, MBA)  Occupational History   Not on file  Tobacco Use   Smoking status: Former   Smokeless tobacco: Never   Tobacco comments:    Quit 1985  Vaping Use   Vaping status: Never Used  Substance and Sexual Activity   Alcohol use: Yes    Alcohol/week: 28.0 standard drinks of alcohol    Types: 28 Glasses of wine per week   Drug use: No   Sexual activity: Yes    Partners: Female    Birth control/protection: None  Other Topics Concern   Not on file  Social History Narrative   Pt previously ran for [employer] and active on several boards in the community, 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   Social Drivers of Corporate investment banker Strain: Low Risk  (11/26/2023)   Overall Financial Resource Strain (CARDIA)    Difficulty of Paying Living Expenses: Not hard at all  Food Insecurity: No Food Insecurity (11/26/2023)   Hunger Vital Sign    Worried About Running Out of Food in the Last Year: Never true    Ran Out of Food in the Last Year: Never true  Transportation Needs: No Transportation Needs (11/26/2023)   PRAPARE - Administrator, Civil Service (Medical): No    Lack of Transportation (Non-Medical): No  Physical Activity: Sufficiently Active (11/26/2023)   Exercise Vital Sign    Days of Exercise per Week: 3 days    Minutes of Exercise per Session: 50 min  Stress: No Stress Concern Present (11/26/2023)   [employer] of Occupational  Health - Occupational Stress Questionnaire    Feeling of Stress : Not at all  Social Connections: Socially Integrated (11/26/2023)   Social Connection and Isolation Panel [NHANES]    Frequency of Communication with Friends and Family:  Three times a week    Frequency of Social Gatherings with Friends and Family: Once a week    Attends Religious Services: More than 4 times per year    Active Member of [employer] or Organizations: Yes    Attends Engineer, structural: More than 4 times per year    Marital Status: Married  Catering manager Violence: Not At Risk (11/27/2023)   Humiliation, Afraid, Rape, and Kick questionnaire    Fear of Current or Ex-Partner: No    Emotionally Abused: No    Physically Abused: No    Sexually Abused: No     Current Outpatient Medications:    atorvastatin (LIPITOR) 20 MG tablet, Take 1 tablet (20 mg total) by mouth at bedtime., Disp: 90 tablet, Rfl: 3   levothyroxine (SYNTHROID) 112 MCG tablet, Take 1 tablet (112 mcg total) by mouth daily with breakfast., Disp: 90 tablet, Rfl: 3  Allergies  Allergen Reactions   Tetanus Toxoids Swelling    Fever    Penicillins Swelling and Rash    Has patient had a PCN reaction causing immediate rash, facial/tongue/throat swelling, SOB or lightheadedness with hypotension: yes Has patient had a PCN reaction causing severe rash involving mucus membranes or skin necrosis: no Has patient had a PCN reaction that required hospitalization: no Has patient had a PCN reaction occurring within the last 10 years: no If all of the above answers are "NO", then may proceed with Cephalosporin use.      Review of Systems  All other systems reviewed and are negative.    Objective  Vitals:   11/27/23 0845  Pulse: 68  Resp: 16  Temp: 97.9 F (36.6 C)  TempSrc: Oral  SpO2: 98%  Weight: 176 lb 6.4 oz (80 kg)  Height: 6\' 1"  (1.854 m)    Body mass index is 23.27 kg/m.  Physical Exam Constitutional:      Appearance:  Normal appearance.  HENT:     Head: Normocephalic and atraumatic.     Mouth/Throat:     Mouth: Mucous membranes are moist.     Pharynx: Oropharynx is clear.  Eyes:     Extraocular Movements: Extraocular movements intact.     Conjunctiva/sclera: Conjunctivae normal.     Pupils: Pupils are equal, round, and reactive to light.  Neck:     Comments: No thyromegaly Cardiovascular:     Rate and Rhythm: Normal rate and regular rhythm.  Pulmonary:     Effort: Pulmonary effort is normal.     Breath sounds: Normal breath sounds.  Musculoskeletal:     Cervical back: No tenderness.     Right lower leg: No edema.     Left lower leg: No edema.  Lymphadenopathy:     Cervical: No cervical adenopathy.  Skin:    General: Skin is warm and dry.  Neurological:     General: No focal deficit present.     Mental Status: He is alert. Mental status is at baseline.  Psychiatric:        Mood and Affect: Mood normal.        Behavior: Behavior normal.     Last CBC Lab Results  Component Value Date   WBC 3.7 (L) 11/25/2022   HGB 14.6 11/25/2022   HCT 42.8 11/25/2022   MCV 97.5 11/25/2022   MCH 33.3 (H) 11/25/2022   RDW 12.8 11/25/2022   PLT 182 11/25/2022   Last metabolic panel Lab Results  Component Value Date   GLUCOSE 91 11/25/2022  NA 142 11/25/2022   K 4.2 11/25/2022   CL 105 11/25/2022   CO2 29 11/25/2022   BUN 14 11/25/2022   CREATININE 0.80 11/25/2022   EGFR 92 11/25/2022   CALCIUM 9.3 11/25/2022   PROT 6.9 11/25/2022   ALBUMIN 4.3 09/23/2022   LABGLOB 2.3 11/09/2015   AGRATIO 2.0 11/09/2015   BILITOT 0.9 11/25/2022   ALKPHOS 100 09/23/2022   AST 24 11/25/2022   ALT 25 11/25/2022   ANIONGAP 8 09/23/2022   Last lipids Lab Results  Component Value Date   CHOL 128 11/25/2022   HDL 70 11/25/2022   LDLCALC 45 11/25/2022   TRIG 53 11/25/2022   CHOLHDL 1.8 11/25/2022   Last hemoglobin A1c Lab Results  Component Value Date   HGBA1C 5.8 (H) 11/25/2022   Last thyroid  functions Lab Results  Component Value Date   TSH 1.234 09/23/2022   Last vitamin D No results found for: "25OHVITD2", "25OHVITD3", "VD25OH" Last vitamin B12 and Folate No results found for: "VITAMINB12", "FOLATE"    Assessment & Plan  1. Annual physical exam (Primary): Physical exam completed, health maintenance reviewed and annual labs ordered.   - CBC w/Diff/Platelet - COMPLETE METABOLIC PANEL WITH GFR - Lipid Profile - HgB A1c - PSA - TSH  2. Hypothyroidism due to acquired atrophy of thyroid: Recheck TSH today. Symptoms stable on current dose of Levothyroxine, will refill today.   - TSH - levothyroxine (SYNTHROID) 112 MCG tablet; Take 1 tablet (112 mcg total) by mouth daily with breakfast.  Dispense: 90 tablet; Refill: 3  3. Mixed hyperlipidemia: Check fasting cholesterol and refill Lipitor.   - Lipid Profile - atorvastatin (LIPITOR) 20 MG tablet; Take 1 tablet (20 mg total) by mouth at bedtime.  Dispense: 90 tablet; Refill: 3  4. Prediabetes: Recheck A1c.   - HgB A1c  5. Screening for prostate cancer/Family history of prostate cancer in father/Nocturia: PSA with above labs as well.  - PSA  -Prostate cancer screening and PSA options (with potential risks and benefits of testing vs not testing) were discussed along with recent recs/guidelines. -USPSTF grade A and B recommendations reviewed with patient; age-appropriate recommendations, preventive care, screening tests, etc discussed and encouraged; healthy living encouraged; see AVS for patient education given to patient -Discussed importance of 150 minutes of physical activity weekly, eat two servings of fish weekly, eat one serving of tree nuts ( cashews, pistachios, pecans, almonds.Marland Kitchen) every other day, eat 6 servings of fruit/vegetables daily and drink plenty of water and avoid sweet beverages.  -Reviewed Health Maintenance: yes

## 2023-11-28 LAB — COMPLETE METABOLIC PANEL WITH GFR
AG Ratio: 1.9 (calc) (ref 1.0–2.5)
ALT: 29 U/L (ref 9–46)
AST: 29 U/L (ref 10–35)
Albumin: 4.8 g/dL (ref 3.6–5.1)
Alkaline phosphatase (APISO): 101 U/L (ref 35–144)
BUN: 14 mg/dL (ref 7–25)
CO2: 28 mmol/L (ref 20–32)
Calcium: 9.5 mg/dL (ref 8.6–10.3)
Chloride: 103 mmol/L (ref 98–110)
Creat: 0.89 mg/dL (ref 0.70–1.28)
Globulin: 2.5 g/dL (ref 1.9–3.7)
Glucose, Bld: 102 mg/dL — ABNORMAL HIGH (ref 65–99)
Potassium: 5 mmol/L (ref 3.5–5.3)
Sodium: 140 mmol/L (ref 135–146)
Total Bilirubin: 0.7 mg/dL (ref 0.2–1.2)
Total Protein: 7.3 g/dL (ref 6.1–8.1)
eGFR: 89 mL/min/{1.73_m2} (ref >=60)

## 2023-11-28 LAB — CBC WITH DIFFERENTIAL/PLATELET
Absolute Lymphocytes: 1291 {cells}/uL (ref 850–3900)
Absolute Monocytes: 318 {cells}/uL (ref 200–950)
Basophils Absolute: 30 {cells}/uL (ref 0–200)
Basophils Relative: 0.8 %
Eosinophils Absolute: 218 {cells}/uL (ref 15–500)
Eosinophils Relative: 5.9 %
HCT: 45 % (ref 38.5–50.0)
Hemoglobin: 14.9 g/dL (ref 13.2–17.1)
MCH: 33.4 pg — ABNORMAL HIGH (ref 27.0–33.0)
MCHC: 33.1 g/dL (ref 32.0–36.0)
MCV: 100.9 fL — ABNORMAL HIGH (ref 80.0–100.0)
MPV: 10.5 fL (ref 7.5–12.5)
Monocytes Relative: 8.6 %
Neutro Abs: 1843 {cells}/uL (ref 1500–7800)
Neutrophils Relative %: 49.8 %
Platelets: 200 10*3/uL (ref 140–400)
RBC: 4.46 10*6/uL (ref 4.20–5.80)
RDW: 12.8 % (ref 11.0–15.0)
Total Lymphocyte: 34.9 %
WBC: 3.7 10*3/uL — ABNORMAL LOW (ref 3.8–10.8)

## 2023-11-28 LAB — LIPID PANEL
Cholesterol: 126 mg/dL (ref <200)
HDL: 67 mg/dL (ref >=40)
LDL Cholesterol (Calc): 45 mg/dL
Non-HDL Cholesterol (Calc): 59 mg/dL (ref <130)
Total CHOL/HDL Ratio: 1.9 (calc) (ref <5.0)
Triglycerides: 63 mg/dL (ref <150)

## 2023-11-28 LAB — HEMOGLOBIN A1C
Hgb A1c MFr Bld: 5.8 %{Hb} — ABNORMAL HIGH (ref <5.7)
Mean Plasma Glucose: 120 mg/dL
eAG (mmol/L): 6.6 mmol/L

## 2023-11-28 LAB — TSH: TSH: 3.94 m[IU]/L (ref 0.40–4.50)

## 2023-11-28 LAB — PSA: PSA: 0.72 ng/mL (ref <=4.00)

## 2023-12-28 DIAGNOSIS — Z23 Encounter for immunization: Secondary | ICD-10-CM | POA: Diagnosis not present

## 2024-02-03 ENCOUNTER — Ambulatory Visit: Payer: Medicare Other

## 2024-02-03 VITALS — Ht 73.0 in | Wt 176.3 lb

## 2024-02-03 DIAGNOSIS — Z0000 Encounter for general adult medical examination without abnormal findings: Secondary | ICD-10-CM

## 2024-02-03 NOTE — Progress Notes (Signed)
 Because this visit was a virtual/telehealth visit,  certain criteria was not obtained, such a blood pressure, CBG if applicable, and timed get up and go. Any medications not marked as "taking" were not mentioned during the medication reconciliation part of the visit. Any vitals not documented were not able to be obtained due to this being a telehealth visit or patient was unable to self-report a recent blood pressure reading due to a lack of equipment at home via telehealth. Vitals that have been documented are verbally provided by the patient.   Subjective:   Marc Jackson is a 78 y.o. who presents for a Medicare Wellness preventive visit.  Visit Complete: Virtual I connected with  Marc Jackson on 02/03/24 by a audio enabled telemedicine application and verified that I am speaking with the correct person using two identifiers.  Patient Location: Home  Provider Location: Office/Clinic  I discussed the limitations of evaluation and management by telemedicine. The patient expressed understanding and agreed to proceed.  Vital Signs: Because this visit was a virtual/telehealth visit, some criteria may be missing or patient reported. Any vitals not documented were not able to be obtained and vitals that have been documented are patient reported.  VideoDeclined- This patient declined Librarian, academic. Therefore the visit was completed with audio only.  Persons Participating in Visit: Patient.  AWV Questionnaire: No: Patient Medicare AWV questionnaire was not completed prior to this visit.  Cardiac Risk Factors include: advanced age (>41men, >69 women);dyslipidemia;family history of premature cardiovascular disease;male gender     Objective:    Today's Vitals   02/03/24 0912  Weight: 176 lb 4.8 oz (80 kg)  Height: 6\' 1"  (1.854 m)  PainSc: 0-No pain   Body mass index is 23.26 kg/m.     02/03/2024    9:15 AM 09/23/2022    4:36 PM 11/21/2021    9:01 AM  11/20/2020    8:48 AM 02/10/2020    8:33 AM 11/17/2019    9:07 AM 11/12/2018    9:43 AM  Advanced Directives  Does Patient Have a Medical Advance Directive? Yes No Yes Yes Yes Yes Yes  Type of Estate agent of [place];Living will  Healthcare Power of [place];Living will Healthcare Power of [place];Living will Living will Healthcare Power of [place];Living will Healthcare Power of [place];Living will  Does patient want to make changes to medical advance directive? No - Patient declined        Copy of Healthcare Power of Attorney in Chart? Yes - validated most recent copy scanned in chart (See row information)  Yes - validated most recent copy scanned in chart (See row information) No - copy requested  No - copy requested No - copy requested  Would patient like information on creating a medical advance directive?  No - Patient declined         Current Medications (verified) Outpatient Encounter Medications as of 02/03/2024  Medication Sig   atorvastatin  (LIPITOR) 20 MG tablet Take 1 tablet (20 mg total) by mouth at bedtime.   levothyroxine  (SYNTHROID ) 112 MCG tablet Take 1 tablet (112 mcg total) by mouth daily with breakfast.   No facility-administered encounter medications on file as of 02/03/2024.    Allergies (verified) Tetanus toxoids and Penicillins   History: Past Medical History:  Diagnosis Date   Alcohol use 11/23/2016   Allergic rhinitis with postnasal drip 11/09/2015   Allergy    Penicillin, Tetnus Toxoid   Annual physical exam 11/09/2015  Cancer [facility])    SKIN   Cataract    Hyperglycemia 11/10/2017   Hyperlipidemia    Hypothyroidism    IBS (irritable bowel syndrome)    Thyroid  disease    Past Surgical History:  Procedure Laterality Date   BACK SURGERY     CATARACT EXTRACTION W/PHACO Right 11/24/2017   Procedure: CATARACT EXTRACTION PHACO AND INTRAOCULAR LENS PLACEMENT (IOC);  Surgeon: Clair Crews, MD;  Location: [facility] ORS;  Service:  Ophthalmology;  Laterality: Right;  US  00:28.3 AP% 14.6 CDE 4.12 Fluid pack Lot # [id number]   CATARACT EXTRACTION W/PHACO Left 12/29/2017   Procedure: CATARACT EXTRACTION PHACO AND INTRAOCULAR LENS PLACEMENT (IOC);  Surgeon: Clair Crews, MD;  Location: [facility] ORS;  Service: Ophthalmology;  Laterality: Left;  US  00:30.4 AP% 14.0 CDE 4.25 Fluid Pack Lot # [id number]   CO2 LASER OF [place]UKOPLAKIA     COLONOSCOPY WITH PROPOFOL  N/A 02/10/2020   Procedure: COLONOSCOPY WITH PROPOFOL ;  Surgeon: Luke Salaam, MD;  Location: [facility] ENDOSCOPY;  Service: Gastroenterology;  Laterality: N/A;  COVID POSITIVE ON December 24, 2019   discetomy     EYE SURGERY     Cataracts; laser procedure for torn retina   HERNIA REPAIR     X 2   SKIN CANCER DESTRUCTION     SPINE SURGERY     Partial diskectomy, laminectomy L4-L5   Family History  Problem Relation Age of Onset   Cancer Father        prostate   Heart disease Father    Hypothyroidism Sister    Heart disease Mother        CHF, atrial fibrillation   Hyperlipidemia Mother        high TG   Diabetes Brother    Heart disease Brother    Obesity Brother    Diabetes Paternal Grandmother    Social History   Socioeconomic History   Marital status: Married    Spouse name: Not on file   Number of children: 0   Years of education: Not on file   Highest education level: Master's degree (e.g., MA, MS, MEng, MEd, MSW, MBA)  Occupational History   Not on file  Tobacco Use   Smoking status: Former   Smokeless tobacco: Never   Tobacco comments:    Quit 1985  Vaping Use   Vaping status: Never Used  Substance and Sexual Activity   Alcohol use: Yes    Alcohol/week: 28.0 standard drinks of alcohol    Types: 28 Glasses of wine per week   Drug use: No   Sexual activity: Yes    Partners: Female    Birth control/protection: None  Other Topics Concern   Not on file  Social History Narrative   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, 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   Social Drivers of Corporate investment banker Strain: Low Risk  (02/03/2024)   Overall Financial Resource Strain (CARDIA)    Difficulty of Paying Living Expenses: Not hard at all  Food Insecurity: No Food Insecurity (02/03/2024)   Hunger Vital Sign    Worried About Running Out of Food in the Last Year: Never true    Ran Out of Food in the Last Year: Never true  Transportation Needs: No Transportation Needs (02/03/2024)   PRAPARE - Administrator, Civil Service (Medical): No    Lack of Transportation (Non-Medical): No  Physical Activity: Sufficiently Active (  02/03/2024)   Exercise Vital Sign    Days of Exercise per Week: 3 days    Minutes of Exercise per Session: 50 min  Stress: No Stress Concern Present (02/03/2024)   [employer] of Occupational Health - Occupational Stress Questionnaire    Feeling of Stress : Not at all  Social Connections: Socially Integrated (02/03/2024)   Social Connection and Isolation Panel [NHANES]    Frequency of Communication with Friends and Family: Three times a week    Frequency of Social Gatherings with Friends and Family: Once a week    Attends Religious Services: More than 4 times per year    Active Member of [employer] or Organizations: Yes    Attends Engineer, structural: More than 4 times per year    Marital Status: Married    Tobacco Counseling Counseling given: Not Answered Tobacco comments: Quit 1985    Clinical Intake:  Pre-visit preparation completed: Yes  Pain : No/denies pain Pain Score: 0-No pain     BMI - recorded: 23.26 Nutritional Status: BMI of 19-24  Normal Nutritional Risks: None Diabetes: No  Lab Results  Component Value Date   HGBA1C 5.8 (H) 11/27/2023   HGBA1C 5.8 (H) 11/25/2022   HGBA1C 5.5 11/21/2021     How often do you need to have someone help you when you read instructions, pamphlets, or other written  materials from your doctor or pharmacy?: 1 - Never What is the last grade level you completed in school?: MASTER'S DEGREE  Interpreter Needed?: No  Information entered by :: Zaida Reiland N. Jsiah Menta, LPN.   Activities of Daily Living     02/03/2024    9:17 AM 11/27/2023    9:03 AM  In your present state of health, do you have any difficulty performing the following activities:  Hearing? 0 0  Vision? 0 0  Difficulty concentrating or making decisions? 0 0  Walking or climbing stairs? 0 0  Dressing or bathing? 0 0  Doing errands, shopping? 0 0  Preparing Food and eating ? N   Using the Toilet? N   In the past six months, have you accidently leaked urine? N   Do you have problems with loss of bowel control? N   Managing your Medications? N   Managing your Finances? N   Housekeeping or managing your Housekeeping? N     Patient Care Team: Rockney Cid, DO as PCP - General (Internal Medicine) Dasher, Margette Sheldon, MD (Dermatology) Clair Crews, MD as Referring Physician (Ophthalmology)  Indicate any recent Medical Services you may have received from other than [facility] providers in the past year (date may be approximate).     Assessment:   This is a routine wellness examination for Marc Jackson.  Hearing/Vision screen Hearing Screening - Comments:: Denies hearing difficulties.  Vision Screening - Comments:: Wears reading glasses - up to date with routine eye exams with [facility]     Goals Addressed             This Visit's Progress    Patient Stated       02/03/2024: To continue to take my probiotics regularly for gut health.       Depression Screen     02/03/2024    9:16 AM 11/27/2023    9:03 AM 11/25/2022    9:43 AM 11/21/2021    8:59 AM 11/20/2020   10:15 AM 11/20/2020    8:47 AM 11/17/2019    9:05 AM  PHQ 2/9 Scores  PHQ -  2 Score 0 0 0 0 0 0 0  PHQ- 9 Score 0  0  0      Fall Risk     02/03/2024    9:16 AM 11/27/2023    9:03 AM 11/28/2022    9:40 AM  11/25/2022    9:43 AM 11/21/2021    9:01 AM  Fall Risk   Falls in the past year? 0 0 0 0 1  Number falls in past yr: 0 0 0 0 0  Injury with Fall? 0 0 0 0 0  Risk for fall due to : No Fall Risks No Fall Risks No Fall Risks  No Fall Risks  Follow up Falls prevention discussed;Falls evaluation completed Falls evaluation completed Education provided;Falls prevention discussed  Falls prevention discussed    MEDICARE RISK AT HOME:  Medicare Risk at Home Any stairs in or around the home?: Yes If so, are there any without handrails?: No Home free of loose throw rugs in walkways, pet beds, electrical cords, etc?: Yes Adequate lighting in your home to reduce risk of falls?: Yes Life alert?: No Use of a cane, walker or w/c?: No Grab bars in the bathroom?: No Shower chair or bench in shower?: No Elevated toilet seat or a handicapped toilet?: Yes  TIMED UP AND GO:  Was the test performed?  No  Cognitive Function: 6CIT completed    02/03/2024    9:19 AM  MMSE - Mini Mental State Exam  Not completed: Unable to complete        02/03/2024    9:19 AM 11/28/2022    9:47 AM 11/12/2018    9:48 AM 11/10/2017    9:39 AM  6CIT Screen  What Year? 0 points 0 points 0 points 0 points  What month? 0 points 0 points 0 points 0 points  What time? 0 points 0 points 0 points 0 points  Count back from 20 0 points 0 points 0 points 0 points  Months in reverse 0 points 0 points 0 points 0 points  Repeat phrase 0 points 0 points 0 points 0 points  Total Score 0 points 0 points 0 points 0 points    Immunizations Immunization History  Administered Date(s) Administered   Fluad Quad(high Dose 65+) 07/19/2019, 08/16/2020   Influenza, High Dose Seasonal PF 08/24/2018, 08/02/2021, 06/19/2023   Influenza-Unspecified 07/30/2016, 07/30/2017, 08/14/2022   PFIZER(Purple Top)SARS-COV-2 Vaccination 12/03/2019, 01/03/2020, 07/10/2020, 01/15/2021, 12/28/2023   PNEUMOCOCCAL CONJUGATE-20 11/21/2021   Pfizer Covid-19  Vaccine Bivalent Booster 53yrs & up 08/02/2021, 08/14/2022   Pfizer(Comirnaty)Fall Seasonal Vaccine 12 years and older 06/19/2023   Pneumococcal Conjugate-13 09/14/2014   Pneumococcal Polysaccharide-23 08/08/2013   Rsv, Bivalent, Protein Subunit Rsvpref,pf Pattricia Bores) 09/10/2022   Zoster Recombinant(Shingrix) 05/16/2018, 09/04/2018   Zoster, Live 07/13/2010    Screening Tests Health Maintenance  Topic Date Due   INFLUENZA VACCINE  05/13/2024   COVID-19 Vaccine (8 - 2024-25 season) 06/29/2024   Medicare Annual Wellness (AWV)  02/02/2025   Pneumonia Vaccine 35+ Years old  Completed   Hepatitis C Screening  Completed   Zoster Vaccines- Shingrix  Completed   HPV VACCINES  Aged Out   Meningococcal B Vaccine  Aged Out   DTaP/Tdap/Td  Discontinued   Colonoscopy  Discontinued    Health Maintenance  There are no preventive care reminders to display for this patient. Health Maintenance Items Addressed: Yes  Additional Screening:  Vision Screening: Recommended annual ophthalmology exams for early detection of glaucoma and other disorders of the eye.  Dental Screening: Recommended annual dental exams for proper oral hygiene  Community Resource Referral / Chronic Care Management: CRR required this visit?  No   CCM required this visit?  No     Plan:     I have personally reviewed and noted the following in the patient's chart:   Medical and social history Use of alcohol, tobacco or illicit drugs  Current medications and supplements including opioid prescriptions. Patient is not currently taking opioid prescriptions. Functional ability and status Nutritional status Physical activity Advanced directives List of other physicians Hospitalizations, surgeries, and ER visits in previous 12 months Vitals Screenings to include cognitive, depression, and falls Referrals and appointments  In addition, I have reviewed and discussed with patient certain preventive protocols, quality  metrics, and best practice recommendations. A written personalized care plan for preventive services as well as general preventive health recommendations were provided to patient.     Margette Sheldon, LPN   1/61/0960   After Visit Summary: (MyChart) Due to this being a telephonic visit, the after visit summary with patients personalized plan was offered to patient via MyChart   Notes: Nothing significant to report at this time.

## 2024-02-03 NOTE — Patient Instructions (Addendum)
 Marc Jackson , Thank you for taking time to come for your Medicare Wellness Visit. I appreciate your ongoing commitment to your health goals. Please review the following plan we discussed and let me know if I can assist you in the future.   Referrals/Orders/Follow-Ups/Clinician Recommendations: Yes, keep maintaining your health by keeping your appointments with Dr. Rockney Cid and any specialists that you may see.  Call us  if you need anything.  Have a great year!!!!  This is a list of the screening recommended for you and due dates:  Health Maintenance  Topic Date Due   Flu Shot  05/13/2024   COVID-19 Vaccine (8 - 2024-25 season) 06/29/2024   Medicare Annual Wellness Visit  02/02/2025   Pneumonia Vaccine  Completed   Hepatitis C Screening  Completed   Zoster (Shingles) Vaccine  Completed   HPV Vaccine  Aged Out   Meningitis B Vaccine  Aged Out   DTaP/Tdap/Td vaccine  Discontinued   Colon Cancer Screening  Discontinued    Advanced directives: (In Chart) A copy of your advanced directives are scanned into your chart should your provider ever need it.  Next Medicare Annual Wellness Visit scheduled for next year: Yes, It was nice speaking with you today! Your next Annual Wellness Visit is scheduled for 02/03/2025 at 9:30 a.m. via PHONE VISIT. If you need to reschedule or cancel, please call [phone].

## 2024-06-04 LAB — GLUCOSE, POCT (MANUAL RESULT ENTRY): POC Glucose: 111 mg/dL — AB (ref 70–99)

## 2024-06-06 NOTE — Congregational Nurse Program (Signed)
  Dept: [phone]   Congregational Nurse Program Note  Date of Encounter: 06/04/2024  Past Medical History: Past Medical History:  Diagnosis Date   Alcohol use 11/23/2016   Allergic rhinitis with postnasal drip 11/09/2015   Allergy    Penicillin, Tetnus Toxoid   Annual physical exam 11/09/2015   Cancer [facility])    SKIN   Cataract    Hyperglycemia 11/10/2017   Hyperlipidemia    Hypothyroidism    IBS (irritable bowel syndrome)    Thyroid  disease     Encounter Details:  Community Questionnaire - 06/06/24 1933       Questionnaire   Ask client: Do you give verbal consent for me to treat you today? Yes    Student Assistance N/A    Location Patient Served  [place]   [employer] event   Encounter Setting Other   [employer] event   Population Status Unknown    Insurance Medicare    Insurance/Financial Assistance Referral N/A    Medication N/A    Medical Provider Yes    Screening Referrals Made N/A    Medical Referrals Made N/A    Medical Appointment Completed N/A    CNP Interventions Advocate/Support    Screenings CN Performed Blood Pressure;Blood Glucose    ED Visit Averted N/A    Life-Saving Intervention Made N/A          Today's Vitals   06/04/24 1000  BP: 126/63  Pulse: 66  SpO2: 95%   There is no height or weight on file to calculate BMI.

## 2024-07-12 DIAGNOSIS — Z23 Encounter for immunization: Secondary | ICD-10-CM | POA: Diagnosis not present

## 2024-09-13 DIAGNOSIS — D2271 Melanocytic nevi of right lower limb, including hip: Secondary | ICD-10-CM | POA: Diagnosis not present

## 2024-09-13 DIAGNOSIS — D2262 Melanocytic nevi of left upper limb, including shoulder: Secondary | ICD-10-CM | POA: Diagnosis not present

## 2024-09-13 DIAGNOSIS — D225 Melanocytic nevi of trunk: Secondary | ICD-10-CM | POA: Diagnosis not present

## 2024-09-13 DIAGNOSIS — D2261 Melanocytic nevi of right upper limb, including shoulder: Secondary | ICD-10-CM | POA: Diagnosis not present

## 2024-09-13 DIAGNOSIS — D2272 Melanocytic nevi of left lower limb, including hip: Secondary | ICD-10-CM | POA: Diagnosis not present

## 2024-09-13 DIAGNOSIS — L821 Other seborrheic keratosis: Secondary | ICD-10-CM | POA: Diagnosis not present

## 2024-09-23 DIAGNOSIS — H35372 Puckering of macula, left eye: Secondary | ICD-10-CM | POA: Diagnosis not present

## 2024-09-23 DIAGNOSIS — H43813 Vitreous degeneration, bilateral: Secondary | ICD-10-CM | POA: Diagnosis not present

## 2024-09-23 DIAGNOSIS — Z961 Presence of intraocular lens: Secondary | ICD-10-CM | POA: Diagnosis not present

## 2024-11-28 ENCOUNTER — Ambulatory Visit: Payer: Medicare Other | Admitting: Internal Medicine

## 2025-02-03 ENCOUNTER — Ambulatory Visit

## 2025-02-10 ENCOUNTER — Ambulatory Visit
